# Patient Record
Sex: Male | Born: 1990 | Race: Black or African American | Hispanic: No | Marital: Single | Smoking: Former smoker
Health system: Southern US, Community
[De-identification: ages and names within clinical notes are randomized; demographics above are authoritative.]

## PROBLEM LIST (undated history)

## (undated) ENCOUNTER — Emergency Department (HOSPITAL_COMMUNITY): Payer: 59

## (undated) DIAGNOSIS — J40 Bronchitis, not specified as acute or chronic: Secondary | ICD-10-CM

## (undated) HISTORY — PX: MYRINGOTOMY WITH TUBE PLACEMENT: SHX5663

---
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
## 2011-01-06 NOTE — Assessment & Plan Note (Signed)
Summary: EPH   CC:  follow up hospital.  History of Present Illness: 20 year old male for evaluation of chest pain. Patient seen in emergency room in February of 2011 for chest pain. A chest x-ray showed no active disease. No blood work was obtained. The patient was given nonsteroidals and we were asked to further evaluate.  The patient states that he has had intermittent chest pain. It is in various locations on his chest. Patient states that he has had intermittent chest pain typically associated with stress. It is in various locations on his chest. It typically last one to 2 hours and resolved spontaneously. It does not radiate. There is no associated symptoms. It can increase with deep inspiration. He denies any dyspnea on exertion, orthopnea, PND, pedal edema palpitations or history of syncope. He does not have exertional chest pain. He has not traveled recently. Because of his chest pain we were asked to further evaluate.  Current Medications (verified): 1)  Centrum  Tabs (Multiple Vitamins-Minerals) .Marland Kitchen.. 1 Tab By Mouth Once Daily 2)  Aspirin 81 Mg Tbec (Aspirin) .... As Needed  Past History:  Past Medical History: Patient states that he does have problems with anxiety.  Past Surgical History: Prior tubes in ears  Family History: Reviewed history from 02/18/2010 and no changes required. Mother with H/O atrial fibrillation  Social History: Reviewed history from 02/18/2010 and no changes required. H/O occasional marijuana use in the past No Tobacco Occasional ETOH in the past  Review of Systems       no fevers or chills, productive cough, hemoptysis, dysphasia, odynophagia, melena, hematochezia, dysuria, hematuria, rash, seizure activity, orthopnea, PND, pedal edema, claudication. Remaining systems are negative.   Vital Signs:  Patient profile:   20 year old male Height:      76 inches Weight:      171 pounds BMI:     20.89 Pulse rate:   82 / minute Pulse rhythm:    regular Resp:     12 per minute BP sitting:   118 / 70  (left arm)  Vitals Entered By: Kem Parkinson (February 21, 2010 8:55 AM)  Physical Exam  General:      Well developed/well nourished in NAD Skin warm/dry Patient not depressed No peripheral clubbing Back-normal HEENT-normal/normal eyelids Neck supple/normal carotid upstroke bilaterally; no bruits; no JVD; no thyromegaly chest - CTA/ normal expansion CV - RRR/normal S1 and S2; no murmurs, rubs or gallops;  PMI nondisplaced; No change with Valsalva. Abdomen -NT/ND, no HSM, no mass, + bowel sounds, no bruit 2+ femoral pulses, no bruits Ext-no edema, chords, 2+ DP Neuro-grossly nonfocal     EKG  Procedure date:  02/21/2010  Findings:      Normal sinus rhythm at a rate of 82. Axis normal. No significant ST changes.  Impression & Recommendations:  Problem # 1:  OTHER CHEST PAIN (ICD-786.59) Patient complains of atypical chest pain. His electrocardiogram today is normal. Previous electrocardiogram from January 31, 2010 showed an incomplete right bundle branch block. I doubt his symptoms are cardiac in nature. I will schedule an echocardiogram to assess his LV function as well as an exercise treadmill. If negative I do not anticipate further cardiac evaluation. His updated medication list for this problem includes:    Aspirin 81 Mg Tbec (Aspirin) .Marland Kitchen... As needed  Orders: Echocardiogram (Echo) Treadmill (Treadmill)  Problem # 2:  ANXIETY (ICD-300.00) Patient does state he has had problems with anxiety. I will leave this to his primary care physician.  Patient Instructions: 1)  Your physician has requested that you have an echocardiogram.  Echocardiography is a painless test that uses sound waves to create images of your heart. It provides your doctor with information about the size and shape of your heart and how well your heart's chambers and valves are working.  This procedure takes approximately one hour. There are no  restrictions for this procedure. 2)  Your physician has requested that you have an exercise tolerance test.  For further information please visit [email or web address]  Please also follow instruction sheet, as given.

## 2011-01-06 NOTE — Letter (Signed)
Summary: Physicians Statement Sheet  Physicians Statement Sheet   Imported By: Erle Crocker 02/21/2010 08:50:40  _____________________________________________________________________  External Attachment:    Type:   Image     Comment:   External Document

## 2011-09-12 ENCOUNTER — Emergency Department (HOSPITAL_COMMUNITY)
Admission: EM | Admit: 2011-09-12 | Discharge: 2011-09-12 | Disposition: A | Discharge status: Home / Self Care | Payer: 59 | Attending: Emergency Medicine | Admitting: Emergency Medicine

## 2011-09-12 ENCOUNTER — Emergency Department (HOSPITAL_COMMUNITY): Payer: 59

## 2011-09-12 DIAGNOSIS — R05 Cough: Secondary | ICD-10-CM | POA: Insufficient documentation

## 2011-09-12 DIAGNOSIS — J40 Bronchitis, not specified as acute or chronic: Secondary | ICD-10-CM | POA: Insufficient documentation

## 2011-09-12 DIAGNOSIS — R059 Cough, unspecified: Secondary | ICD-10-CM | POA: Insufficient documentation

## 2012-02-19 ENCOUNTER — Emergency Department (HOSPITAL_COMMUNITY): Payer: 59

## 2012-02-19 ENCOUNTER — Encounter (HOSPITAL_COMMUNITY): Payer: Self-pay | Admitting: Emergency Medicine

## 2012-02-19 ENCOUNTER — Emergency Department (HOSPITAL_COMMUNITY)
Admission: EM | Admit: 2012-02-19 | Discharge: 2012-02-19 | Disposition: A | Discharge status: Home / Self Care | Payer: 59 | Attending: Emergency Medicine | Admitting: Emergency Medicine

## 2012-02-19 DIAGNOSIS — M546 Pain in thoracic spine: Secondary | ICD-10-CM | POA: Insufficient documentation

## 2012-02-19 DIAGNOSIS — R51 Headache: Secondary | ICD-10-CM | POA: Insufficient documentation

## 2012-02-19 DIAGNOSIS — M542 Cervicalgia: Secondary | ICD-10-CM | POA: Insufficient documentation

## 2012-02-19 DIAGNOSIS — R209 Unspecified disturbances of skin sensation: Secondary | ICD-10-CM | POA: Insufficient documentation

## 2012-02-19 DIAGNOSIS — IMO0001 Reserved for inherently not codable concepts without codable children: Secondary | ICD-10-CM | POA: Insufficient documentation

## 2012-02-19 MED ORDER — IBUPROFEN 800 MG PO TABS: 800.0000 mg | ORAL_TABLET | Freq: Three times a day (TID) | ORAL | Status: AC | PRN

## 2012-02-19 MED ORDER — DIAZEPAM 5 MG PO TABS: 5.0000 mg | ORAL_TABLET | Freq: Three times a day (TID) | ORAL | Status: AC | PRN

## 2012-02-19 MED ORDER — HYDROCODONE-ACETAMINOPHEN 5-325 MG PO TABS: 1.0000 | ORAL_TABLET | Freq: Four times a day (QID) | ORAL | Status: AC | PRN

## 2012-02-19 NOTE — ED Provider Notes (Signed)
Medical screening examination/treatment/procedure(s) were performed by non-physician practitioner and as supervising physician I was immediately available for consultation/collaboration.  Loren Racer, MD 02/19/12 2258
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
## 2012-05-24 ENCOUNTER — Encounter (HOSPITAL_COMMUNITY): Payer: Self-pay | Admitting: *Deleted

## 2012-05-24 ENCOUNTER — Emergency Department (HOSPITAL_COMMUNITY)
Admission: EM | Admit: 2012-05-24 | Discharge: 2012-05-25 | Disposition: A | Discharge status: Other Acute Inpatient Hospital | Payer: 59 | Attending: Emergency Medicine | Admitting: Emergency Medicine

## 2012-05-24 DIAGNOSIS — F1210 Cannabis abuse, uncomplicated: Secondary | ICD-10-CM | POA: Insufficient documentation

## 2012-05-24 DIAGNOSIS — F911 Conduct disorder, childhood-onset type: Secondary | ICD-10-CM | POA: Insufficient documentation

## 2012-05-24 DIAGNOSIS — F3289 Other specified depressive episodes: Secondary | ICD-10-CM | POA: Insufficient documentation

## 2012-05-24 DIAGNOSIS — F329 Major depressive disorder, single episode, unspecified: Secondary | ICD-10-CM

## 2012-05-24 DIAGNOSIS — F4320 Adjustment disorder, unspecified: Secondary | ICD-10-CM | POA: Insufficient documentation

## 2012-05-24 DIAGNOSIS — R454 Irritability and anger: Secondary | ICD-10-CM

## 2012-05-24 HISTORY — DX: Bronchitis, not specified as acute or chronic: J40

## 2012-05-24 LAB — CBC
HCT: 44.9 % (ref 39.0–52.0)
Hemoglobin: 15.5 g/dL (ref 13.0–17.0)
MCHC: 34.5 g/dL (ref 30.0–36.0)
MCV: 90.2 fL (ref 78.0–100.0)
Platelets: 183 10*3/uL (ref 150–400)
RDW: 13.6 % (ref 11.5–15.5)

## 2012-05-24 LAB — COMPREHENSIVE METABOLIC PANEL
AST: 14 U/L (ref 0–37)
Albumin: 4.9 g/dL (ref 3.5–5.2)
Alkaline Phosphatase: 58 U/L (ref 39–117)
BUN: 11 mg/dL (ref 6–23)
Calcium: 10.2 mg/dL (ref 8.4–10.5)
GFR calc Af Amer: >90 mL/min (ref >90)
Glucose, Bld: 83 mg/dL (ref 70–99)
Sodium: 140 mEq/L (ref 135–145)
Total Protein: 8.6 g/dL — ABNORMAL HIGH (ref 6.0–8.3)

## 2012-05-24 NOTE — ED Notes (Signed)
Pt reports episodes of severe anger and times where he "blacks out" during anger episodes - pt states after these episodes he is emotionally and physically drained, denies any known triggers. Pt states he rarely drinks, smoke marijuana, last use yesterday. Pt denies any visual or auditory hallucinations. Pt pleasant, calm and cooperative at present.

## 2012-05-25 MED ORDER — ONDANSETRON HCL 4 MG PO TABS: 4.0000 mg | ORAL_TABLET | Freq: Three times a day (TID) | ORAL | Status: DC | PRN

## 2012-05-25 MED ORDER — NICOTINE 21 MG/24HR TD PT24: 21.0000 mg | MEDICATED_PATCH | Freq: Every day | TRANSDERMAL | Status: DC

## 2012-05-25 MED ORDER — LORAZEPAM 1 MG PO TABS: 1.0000 mg | ORAL_TABLET | Freq: Three times a day (TID) | ORAL | Status: DC | PRN

## 2012-05-25 MED ORDER — ZOLPIDEM TARTRATE 5 MG PO TABS: 5.0000 mg | ORAL_TABLET | Freq: Every evening | ORAL | Status: DC | PRN

## 2012-05-25 MED ORDER — IBUPROFEN 600 MG PO TABS: 600.0000 mg | ORAL_TABLET | Freq: Three times a day (TID) | ORAL | Status: DC | PRN

## 2012-05-25 MED ORDER — ALUM & MAG HYDROXIDE-SIMETH 200-200-20 MG/5ML PO SUSP: 30.0000 mL | ORAL | Status: DC | PRN

## 2012-05-25 MED ORDER — ACETAMINOPHEN 325 MG PO TABS: 650.0000 mg | ORAL_TABLET | ORAL | Status: DC | PRN

## 2012-05-25 NOTE — ED Provider Notes (Signed)
Dr. Jacky Kindle (telepsychiatry) deems patient safe for discharge home.   Scott Seamen, MD 05/25/12 [phone]

## 2012-05-25 NOTE — Discharge Instructions (Signed)
Anger Management  Anger is a normal human emotion. However, anger can range from mild irritation to rage. When your anger becomes harmful to yourself or others, it is unhealthy anger.   CAUSES   There are many reasons for unhealthy anger. Many people learn how to express anger from observing how their family expressed anger. In troubled, chaotic, or abusive families, anger can be expressed as rage or even violence. Children can grow up never learning how healthy anger can be expressed. Factors that contribute to unhealthy anger include:    Drug or alcohol abuse.   Post-traumatic stress disorder.   Traumatic brain injury.  COMPLICATIONS   People with unhealthy anger tend to overreact and retaliate against a real or imagined threat. The need to retaliate can turn into violence or verbal abuse against another person. Chronic anger can lead to health problems, such as hypertension, high blood pressure, and depression.  TREATMENT   Exercising, relaxing, meditating, or writing out your feelings all can be beneficial in managing moderate anger. For unhealthy anger, the following methods may be used:   Cognitive-behavioral counseling (learning skills to change the thoughts that influence your mood).   Relaxation training.   Interpersonal counseling.   Assertive communication skills.   Medication.  Document Released: 09/20/2007 Document Revised: 11/12/2011 Document Reviewed: 01/29/2011  ExitCare Patient Information 2012 [employer].

## 2012-05-25 NOTE — ED Provider Notes (Signed)
History     CSN: [id number]  Arrival date & time 05/24/12  2033   First MD Initiated Contact with Patient 05/25/12 0013      12:35 AM [facility] Patient reports episodes of extreme anger and then sudden depression. Reports worsening in the last 2 weeks. States he has been under a significant amount of stress. Mother is with patient and states that she is concerned for his safety. Patient however denies suicidal ideation or homicidal ideation. Denies hallucinations or delusions. Reports occasional marijuana use otherwise denies drug or alcohol abuse. Family history of mental illness with his paternal grandmother. The history is provided by the patient and a parent.    Past Medical History  Diagnosis Date  . Bronchitis     History reviewed. No pertinent past surgical history.  History reviewed. No pertinent family history.  History  Substance Use Topics  . Smoking status: Never Smoker   . Smokeless tobacco: Not on file  . Alcohol Use: Yes     rarely      Review of Systems  Psychiatric/Behavioral: Negative for suicidal ideas, hallucinations and self-injury. The patient is nervous/anxious.   All other systems reviewed and are negative.    Allergies  Review of patient's allergies indicates no known allergies.  Home Medications  No current outpatient prescriptions on file.  BP 134/68  Pulse 91  Temp 98.5 F (36.9 C) (Oral)  Resp 20  SpO2 100%  Physical Exam  Constitutional: He is oriented to person, place, and time. He appears well-developed and well-nourished.  HENT:  Head: Normocephalic and atraumatic.  Eyes: Pupils are equal, round, and reactive to light.  Neurological: He is alert and oriented to person, place, and time.  Skin: Skin is warm and dry. No rash noted. No erythema. No pallor.  Psychiatric: He has a normal mood and affect. His behavior is normal. Judgment and thought content normal.    ED Course  Procedures    MDM    I have placed psych holding  orders for the patient and ordered a consult for tele psych. Discussed patient with Dr. Virgilio Belling, PA-C 05/25/12 [phone]

## 2012-05-25 NOTE — ED Notes (Signed)
Pt placed in blue scrubs and instructed to take all clothes off including undergarments. Pt stated he did not want to take underwear off because he gets cold. Writer informed pt he can get warm blankets back in psych ed. When pt came out of room after dressing writer asked if he took his underwear off. Pt agreed "yea." Security called to wand pt.

## 2012-05-25 NOTE — ED Provider Notes (Signed)
Medical screening examination/treatment/procedure(s) were performed by non-physician practitioner and as supervising physician I was immediately available for consultation/collaboration.   Hanley Seamen, MD 05/25/12 [phone]

## 2012-05-25 NOTE — ED Notes (Signed)
Pt and belongings wanded by security. Belongings placed behind triage nurses station. Belongings include pants, belt, shirt, shoes, hat, notebook, paper, socks, and underwear. Belongings going home with family member.

## 2013-04-12 ENCOUNTER — Ambulatory Visit: Payer: 59 | Admitting: Family Medicine

## 2013-04-27 ENCOUNTER — Ambulatory Visit (INDEPENDENT_AMBULATORY_CARE_PROVIDER_SITE_OTHER): Payer: 59 | Admitting: Family Medicine

## 2013-04-27 ENCOUNTER — Encounter: Payer: Self-pay | Admitting: Family Medicine

## 2013-04-27 VITALS — BP 119/61 | HR 61 | Temp 97.9°F | Ht 75.0 in | Wt 161.5 lb

## 2013-04-27 DIAGNOSIS — F39 Unspecified mood [affective] disorder: Secondary | ICD-10-CM | POA: Insufficient documentation

## 2013-04-27 LAB — CBC WITH DIFFERENTIAL/PLATELET
Basophils Absolute: 0 10*3/uL (ref 0.0–0.1)
Eosinophils Absolute: 0.1 10*3/uL (ref 0.0–0.7)
Lymphocytes Relative: 57 % — ABNORMAL HIGH (ref 12–46)
Monocytes Absolute: 0.3 10*3/uL (ref 0.1–1.0)
Neutro Abs: 1.1 10*3/uL — ABNORMAL LOW (ref 1.7–7.7)
Neutrophils Relative %: 31 % — ABNORMAL LOW (ref 43–77)
RBC: 4.85 MIL/uL (ref 4.22–5.81)
RDW: 14.4 % (ref 11.5–15.5)

## 2013-04-27 LAB — TSH: TSH: 1.243 u[IU]/mL (ref 0.350–4.500)

## 2013-04-27 NOTE — Addendum Note (Signed)
Addended by: Briscoe Deutscher on: 04/27/2013 04:02 PM   Modules accepted: Orders

## 2013-04-27 NOTE — Patient Instructions (Signed)
Scott Carlson, it was nice meeting you today.  It seems you may have some ongoing depression/anxiety.  We will try you on a medication called Prozac and follow you very closely over the next couple of weeks.  Also, please give Dr. Pascal Lux a call about setting up therapy with her.  If you have any questions or start to feel like you may hurt yourself or others, please call us or 911 and stop taking your medication.  Thanks, Dr Paulina Fusi   Fluoxetine capsules or tablets (Depression/Mood Disorders) What is this medicine? FLUOXETINE (floo OX e teen) belongs to a class of drugs known as selective serotonin reuptake inhibitors (SSRIs). It helps to treat mood problems such as depression, obsessive compulsive disorder, and panic attacks. It can also treat certain eating disorders. This medicine may be used for other purposes; ask your health care provider or pharmacist if you have questions. What should I tell my health care provider before I take this medicine? They need to know if you have any of these conditions: -bipolar disorder or mania -diabetes -glaucoma -liver disease -psychosis -seizures -suicidal thoughts or history of attempted suicide -an unusual or allergic reaction to fluoxetine, other medicines, foods, dyes, or preservatives -pregnant or trying to get pregnant -breast-feeding How should I use this medicine? Take this medicine by mouth with a glass of water. Follow the directions on the prescription label. You can take this medicine with or without food. Take your medicine at regular intervals. Do not take it more often than directed. Do not stop taking this medicine suddenly except upon the advice of your doctor. Stopping this medicine too quickly may cause serious side effects or your condition may worsen. A special MedGuide will be given to you by the pharmacist with each prescription and refill. Be sure to read this information carefully each time. Talk to your pediatrician regarding the use of  this medicine in children. While this drug may be prescribed for children as young as 7 years for selected conditions, precautions do apply. Overdosage: If you think you have taken too much of this medicine contact a poison control center or emergency room at once. NOTE: This medicine is only for you. Do not share this medicine with others. What if I miss a dose? If you miss a dose, skip the missed dose and go back to your regular dosing schedule. Do not take double or extra doses. What may interact with this medicine? Do not take fluoxetine with any of the following medications: -other medicines containing fluoxetine, like Sarafem or Symbyax -cisapride -linezolid -MAOIs like Carbex, Eldepryl, Marplan, Nardil, and Parnate -methylene blue (injected into a vein) -pimozide -thioridazine This medicine may also interact with the following medications: -alcohol -aspirin and aspirin-like medicines -carbamazepine -certain medicines for depression, anxiety, or psychotic disturbances -certain medicines for migraine headaches like almotriptan, eletriptan, frovatriptan, naratriptan, rizatriptan, sumatriptan, zolmitriptan -digoxin -diuretics -fentanyl -flecainide -furazolidone -isoniazid -lithium -medicines for sleep -medicines that treat or prevent blood clots like warfarin, enoxaparin, and dalteparin -NSAIDs, medicines for pain and inflammation, like ibuprofen or naproxen -phenytoin -procarbazine -propafenone -rasagiline -ritonavir -supplements like St. John's wort, kava kava, valerian -tramadol -tryptophan -vinblastine This list may not describe all possible interactions. Give your health care provider a list of all the medicines, herbs, non-prescription drugs, or dietary supplements you use. Also tell them if you smoke, drink alcohol, or use illegal drugs. Some items may interact with your medicine. What should I watch for while using this medicine? Tell your doctor if your symptoms  do not get better or if they get worse. Visit your doctor or health care professional for regular checks on your progress. Because it may take several weeks to see the full effects of this medicine, it is important to continue your treatment as prescribed by your doctor. Patients and their families should watch out for new or worsening thoughts of suicide or depression. Also watch out for sudden changes in feelings such as feeling anxious, agitated, panicky, irritable, hostile, aggressive, impulsive, severely restless, overly excited and hyperactive, or not being able to sleep. If this happens, especially at the beginning of treatment or after a change in dose, call your health care professional. [name] may get drowsy or dizzy. Do not drive, use machinery, or do anything that needs mental alertness until you know how this medicine affects you. Do not stand or sit up quickly, especially if you are an older patient. This reduces the risk of dizzy or fainting spells. Alcohol may interfere with the effect of this medicine. Avoid alcoholic drinks. Your mouth may get dry. Chewing sugarless gum or sucking hard candy, and drinking plenty of water may help. Contact your doctor if the problem does not go away or is severe. This medicine may affect blood sugar levels. If you have diabetes, check with your doctor or health care professional before you change your diet or the dose of your diabetic medicine. What side effects may I notice from receiving this medicine? Side effects that you should report to your doctor or health care professional as soon as possible: -allergic reactions like skin rash, itching or hives, swelling of the face, lips, or tongue -breathing problems -confusion -fast or irregular heart rate, palpitations -flu-like fever, chills, cough, muscle or joint aches and pains -seizures -suicidal thoughts or other mood changes -tremors -trouble sleeping -unusual bleeding or bruising -unusually tired  or weak -vomiting Side effects that usually do not require medical attention (report to your doctor or health care professional if they continue or are bothersome): -blurred vision -change in sex drive or performance -diarrhea -dry mouth -flushing -headache -increased or decreased appetite -nausea -sweating This list may not describe all possible side effects. Call your doctor for medical advice about side effects. You may report side effects to FDA at 1-800-FDA-1088. Where should I keep my medicine? Keep out of the reach of children. Store at room temperature between 15 and 30 degrees C (59 and 86 degrees F). Throw away any unused medicine after the expiration date. NOTE: This sheet is a summary. It may not cover all possible information. If you have questions about this medicine, talk to your doctor, pharmacist, or health care provider.  2013, [employer]. (04/08/2012 7:31:49 PM)

## 2013-04-27 NOTE — Progress Notes (Signed)
Parley Pidcock is a 22 y.o. male who presents today for new patient evaluation.  Pt C/O longstanding history of both anxiety and depression.  States he has had these since little child, and was most recently seen by [facility] for this about 1-2 years ago without follow up.  At that time, placed on Xanax which he has not taken in about one yr.  Over the past several weeks and couple of months, pt has had decreased sleep (4 hrs), decreased interest (not wanting to play bball), no guilty feelings, decreased energy and concentration, decreased appetite.  Denies SI/HI and ever having these.    For his anxiety, pt states that he sometimes feels overwhelmed and has pessimistic feelings.  When he has this anxiety, he has diaphoresis, palpitations, nausea, no vomiting and denies feelings of impending doom.   Pt has never been hospitalized for a psychiatric condition.  Does not know of any family members with psychiatry conditions.  Denies drug or alcohol use or abuse.   Past Medical History  Diagnosis Date  . Bronchitis     History  Smoking status  . Never Smoker   Smokeless tobacco  . Not on file    No family history on file.  No current outpatient prescriptions on file prior to visit.   No current facility-administered medications on file prior to visit.    ROS: Per HPI.  All other systems reviewed and are negative.   Physical Exam Filed Vitals:   04/27/13 1507  BP: 119/61  Pulse: 61  Temp: 97.9 F (36.6 C)    Physical Examination: General appearance - alert, well appearing, and in no distress Mental status - normal mood, behavior, speech, dress, motor activity, and thought processes Neck - supple, no significant adenopathy, no thyroid enlargment Chest - clear to auscultation, no wheezes, rales or rhonchi, symmetric air entry Heart - normal rate, regular rhythm, normal S1, S2, no murmurs, rubs, clicks or gallops

## 2013-04-27 NOTE — Assessment & Plan Note (Addendum)
After screening, pt with possible bipolar, moderate severe depression, and severe anxiety.  Will not start on medication today and will discuss with Dr. Pascal Lux about possible pathways to take from here.  Also gave pt Dr. Carola Rhine card for counseling as most likely beneficial.  Originally was going to start on Prozac and see back in one week but since pt screened + for possible Bipolar, will hold off on this and have further discussions.  Will see back in one week.  No Current SI/HI.  Will go ahead and get TSH/CBC to r/o secondary causes.  Addendum: After discussion with Dr. Pascal Lux, will have UDS performed on pt at next visit to evaluate for marijuana use.  Also, pt will be good candidate for psychiatrist referral, especially outpatient behavioral health as insurance through [facility] through mom.

## 2013-06-09 ENCOUNTER — Ambulatory Visit: Payer: Self-pay | Admitting: Family Medicine

## 2013-09-18 ENCOUNTER — Emergency Department (HOSPITAL_COMMUNITY): Payer: 59

## 2013-09-18 ENCOUNTER — Emergency Department (HOSPITAL_COMMUNITY)
Admission: EM | Admit: 2013-09-18 | Discharge: 2013-09-18 | Disposition: A | Discharge status: Home / Self Care | Payer: 59 | Attending: Emergency Medicine | Admitting: Emergency Medicine

## 2013-09-18 ENCOUNTER — Encounter (HOSPITAL_COMMUNITY): Payer: Self-pay | Admitting: Emergency Medicine

## 2013-09-18 DIAGNOSIS — S5010XA Contusion of unspecified forearm, initial encounter: Secondary | ICD-10-CM | POA: Insufficient documentation

## 2013-09-18 DIAGNOSIS — S5012XA Contusion of left forearm, initial encounter: Secondary | ICD-10-CM

## 2013-09-18 MED ORDER — IBUPROFEN 200 MG PO TABS
600.0000 mg | ORAL_TABLET | Freq: Once | ORAL | Status: AC
  Administered 2013-09-18: 600 mg via ORAL
  Filled 2013-09-18: qty 3

## 2013-09-18 MED ORDER — TETANUS-DIPHTH-ACELL PERTUSSIS 5-2.5-18.5 LF-MCG/0.5 IM SUSP
0.5000 mL | Freq: Once | INTRAMUSCULAR | Status: AC
  Administered 2013-09-18: 0.5 mL via INTRAMUSCULAR
  Filled 2013-09-18: qty 0.5
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
## 2013-09-21 NOTE — ED Provider Notes (Signed)
Medical screening examination/treatment/procedure(s) were performed by non-physician practitioner and as supervising physician I was immediately available for consultation/collaboration.   Maloni Musleh J Christropher Gintz, MD 09/21/13 1419 

## 2013-12-11 ENCOUNTER — Emergency Department (HOSPITAL_COMMUNITY): Payer: 59

## 2013-12-11 ENCOUNTER — Encounter (HOSPITAL_COMMUNITY): Payer: Self-pay | Admitting: Emergency Medicine

## 2013-12-11 ENCOUNTER — Emergency Department (HOSPITAL_COMMUNITY)
Admission: EM | Admit: 2013-12-11 | Discharge: 2013-12-11 | Disposition: A | Discharge status: Home / Self Care | Payer: 59 | Attending: Emergency Medicine | Admitting: Emergency Medicine

## 2013-12-11 DIAGNOSIS — F419 Anxiety disorder, unspecified: Secondary | ICD-10-CM

## 2013-12-11 DIAGNOSIS — F411 Generalized anxiety disorder: Secondary | ICD-10-CM | POA: Insufficient documentation

## 2013-12-11 DIAGNOSIS — R1013 Epigastric pain: Secondary | ICD-10-CM | POA: Insufficient documentation

## 2013-12-11 DIAGNOSIS — R197 Diarrhea, unspecified: Secondary | ICD-10-CM | POA: Insufficient documentation

## 2013-12-11 DIAGNOSIS — Z8709 Personal history of other diseases of the respiratory system: Secondary | ICD-10-CM | POA: Insufficient documentation

## 2013-12-11 LAB — CBC WITH DIFFERENTIAL/PLATELET
BASOS PCT: 0 % (ref 0–1)
Basophils Absolute: 0 10*3/uL (ref 0.0–0.1)
EOS ABS: 0.1 10*3/uL (ref 0.0–0.7)
EOS PCT: 2 % (ref 0–5)
HCT: 43.6 % (ref 39.0–52.0)
Hemoglobin: 15.3 g/dL (ref 13.0–17.0)
Lymphocytes Relative: 35 % (ref 12–46)
Lymphs Abs: 1.9 10*3/uL (ref 0.7–4.0)
MCH: 31.2 pg (ref 26.0–34.0)
MCHC: 35.1 g/dL (ref 30.0–36.0)
MCV: 88.8 fL (ref 78.0–100.0)
MONOS PCT: 10 % (ref 3–12)
Monocytes Absolute: 0.5 10*3/uL (ref 0.1–1.0)
Neutro Abs: 2.9 10*3/uL (ref 1.7–7.7)
Neutrophils Relative %: 52 % (ref 43–77)
Platelets: 191 10*3/uL (ref 150–400)
RBC: 4.91 MIL/uL (ref 4.22–5.81)
RDW: 13 % (ref 11.5–15.5)
WBC: 5.5 10*3/uL (ref 4.0–10.5)

## 2013-12-11 LAB — COMPREHENSIVE METABOLIC PANEL
ALK PHOS: 53 U/L (ref 39–117)
ALT: 9 U/L (ref 0–53)
AST: 17 U/L (ref 0–37)
Albumin: 4.4 g/dL (ref 3.5–5.2)
BILIRUBIN TOTAL: 0.6 mg/dL (ref 0.3–1.2)
BUN: 14 mg/dL (ref 6–23)
CHLORIDE: 102 meq/L (ref 96–112)
CO2: 27 mEq/L (ref 19–32)
Calcium: 9.9 mg/dL (ref 8.4–10.5)
Creatinine, Ser: 0.98 mg/dL (ref 0.50–1.35)
GFR calc Af Amer: >90 mL/min (ref >90)
GLUCOSE: 94 mg/dL (ref 70–99)
POTASSIUM: 3.8 meq/L (ref 3.7–5.3)
SODIUM: 142 meq/L (ref 137–147)
Total Protein: 8.1 g/dL (ref 6.0–8.3)

## 2013-12-11 LAB — LIPASE, BLOOD: Lipase: 30 U/L (ref 11–59)

## 2013-12-11 MED ORDER — SODIUM CHLORIDE 0.9 % IV BOLUS (SEPSIS)
1000.0000 mL | Freq: Once | INTRAVENOUS | Status: AC
  Administered 2013-12-11: 1000 mL via INTRAVENOUS

## 2013-12-11 MED ORDER — ONDANSETRON HCL 4 MG/2ML IJ SOLN
4.0000 mg | Freq: Once | INTRAMUSCULAR | Status: AC
  Administered 2013-12-11: 4 mg via INTRAVENOUS

## 2013-12-11 MED ORDER — ONDANSETRON HCL 4 MG/2ML IJ SOLN
4.0000 mg | Freq: Once | INTRAMUSCULAR | Status: DC
  Filled 2013-12-11: qty 2

## 2013-12-11 MED ORDER — MORPHINE SULFATE 4 MG/ML IJ SOLN
4.0000 mg | Freq: Once | INTRAMUSCULAR | Status: AC
  Administered 2013-12-11: 4 mg via INTRAVENOUS
  Filled 2013-12-11: qty 1

## 2013-12-11 MED ORDER — LORAZEPAM 0.5 MG PO TABS: 1.0000 mg | ORAL_TABLET | Freq: Three times a day (TID) | ORAL | Status: DC | PRN

## 2013-12-11 MED ORDER — GI COCKTAIL ~~LOC~~
30.0000 mL | Freq: Once | ORAL | Status: AC
  Administered 2013-12-11: 30 mL via ORAL
  Filled 2013-12-11: qty 30

## 2013-12-11 NOTE — ED Notes (Signed)
Pt states he has been feeling weak x 2 days.  Pt states he has RUQ abdominal pain, denies nausea and vomiting. + diarrhea x 2 in 24 hours.

## 2013-12-11 NOTE — ED Provider Notes (Signed)
CSN: [id number]     Arrival date & time 12/11/13  [id number] History   First MD Initiated Contact with Patient 12/11/13 [phone]     Chief Complaint  Patient presents with  . Abdominal Pain   (Consider location/radiation/quality/duration/timing/severity/associated sxs/prior Treatment) HPI  Scott Carlson is a 23 y.o.male without any significant PMH presents to the ER with complaints of epigastric abdominal pain for the past 2 days with nausea and a small amount of diarrhea. He reports the pain as being constant but has been waxing and waning. He denies having pain like this before, denies having previous abdominal surgeries. He has not had flu like symptoms of sinus congestion, cough, sore throat, myalgias, fever. He also denies taking NSAIDS, eating spicy foods, chocolate, mint, citrus products. He denies injury or anyone else around him feeling the same way. Eating intermittently makes it feel better but he describes feeling full quicker than normal.   Past Medical History  Diagnosis Date  . Bronchitis    Past Surgical History  Procedure Laterality Date  . Myringotomy with tube placement     No family history on file. History  Substance Use Topics  . Smoking status: Never Smoker   . Smokeless tobacco: Not on file  . Alcohol Use: Yes     Comment: Occasionally    Review of Systems The patient denies anorexia, fever, weight loss,, vision loss, decreased hearing, hoarseness, chest pain, syncope, dyspnea on exertion, peripheral edema, balance deficits, hemoptysis, melena, hematochezia, severe indigestion/heartburn, hematuria, incontinence, genital sores, muscle weakness, suspicious skin lesions, transient blindness, difficulty walking, depression, unusual weight change, abnormal bleeding, enlarged lymph nodes, angioedema, and breast masses.  Allergies  Review of patient's allergies indicates no known allergies.  Home Medications   Current Outpatient Rx  Name  Route  Sig  Dispense  Refill    . acetaminophen (TYLENOL) 500 MG tablet   Oral   Take 500 mg by mouth every 6 (six) hours as needed (pain).         . LORazepam (ATIVAN) 0.5 MG tablet   Oral   Take 2 tablets (1 mg total) by mouth 3 (three) times daily as needed for anxiety.   15 tablet   0    BP 122/72  Pulse 65  Temp(Src) 98.1 F (36.7 C) (Oral)  Resp 16  Ht 6\' 3"  (1.905 m)  Wt 175 lb (79.379 kg)  BMI 21.87 kg/m2  SpO2 98% Physical Exam  Nursing note and vitals reviewed. Constitutional: He appears well-developed and well-nourished. No distress.  HENT:  Head: Normocephalic and atraumatic.  Eyes: Pupils are equal, round, and reactive to light.  Neck: Normal range of motion. Neck supple.  Cardiovascular: Normal rate and regular rhythm.   Pulmonary/Chest: Effort normal.  Abdominal: Soft. There is tenderness in the epigastric area. There is guarding (vountary). There is no rigidity, no rebound, no CVA tenderness, no tenderness at McBurney's point and negative Murphy's sign.    Neurological: He is alert.  Skin: Skin is warm and dry.    ED Course  Procedures (including critical care time) Labs Review Labs Reviewed  COMPREHENSIVE METABOLIC PANEL  LIPASE, BLOOD  CBC WITH DIFFERENTIAL   Imaging Review Dg Abd 2 Views  12/11/2013   CLINICAL DATA:  Upper abdominal pain.  Diarrhea.  EXAM: ABDOMEN - 2 VIEW  COMPARISON:  No priors.  FINDINGS: Gas and stool are seen scattered throughout the colon extending to the level of the distal rectum. No pathologic distension of small bowel is noted. No  gross evidence of pneumoperitoneum.  IMPRESSION: 1.  Nonobstructive bowel gas pattern. 2. No pneumoperitoneum.   Electronically Signed   By: Trudie Reed M.D.   On: 12/11/2013 08:03    EKG Interpretation   None       MDM   1. Diarrhea   2. Anxiety    Patients labs and xray are showing no concerning findings.   Mom present at end of patient contact and says he has really bad anxiety and plans to get him to a  behavioral health provider. Patient no longer having any pain or symptoms.  23 y.o.Scott Carlson's evaluation in the Emergency Department is complete. It has been determined that no acute conditions requiring further emergency intervention are present at this time. The patient/guardian have been advised of the diagnosis and plan. We have discussed signs and symptoms that warrant return to the ED, such as changes or worsening in symptoms.  Vital signs are stable at discharge. Filed Vitals:   12/11/13 0601  BP: 122/72  Pulse: 65  Temp: 98.1 F (36.7 C)  Resp: 16    Patient/guardian has voiced understanding and agreed to follow-up with the PCP or specialist.    Dorthula Matas, PA-C 12/11/13 [phone]

## 2013-12-11 NOTE — ED Provider Notes (Signed)
Medical screening examination/treatment/procedure(s) were performed by non-physician practitioner and as supervising physician I was immediately available for consultation/collaboration.  EKG Interpretation   None        Shon Batonourtney F Gerlean Cid, MD 12/11/13 1701

## 2013-12-11 NOTE — Discharge Instructions (Signed)
Diarrhea Diarrhea is frequent loose and watery bowel movements. It can cause you to feel weak and dehydrated. Dehydration can cause you to become tired and thirsty, have a dry mouth, and have decreased urination that often is dark yellow. Diarrhea is a sign of another problem, most often an infection that will not last long. In most cases, diarrhea typically lasts 2 3 days. However, it can last longer if it is a sign of something more serious. It is important to treat your diarrhea as directed by your caregive to lessen or prevent future episodes of diarrhea. CAUSES  Some common causes include:  Gastrointestinal infections caused by viruses, bacteria, or parasites.  Food poisoning or food allergies.  Certain medicines, such as antibiotics, chemotherapy, and laxatives.  Artificial sweeteners and fructose.  Digestive disorders. HOME CARE INSTRUCTIONS  Ensure adequate fluid intake (hydration): have 1 cup (8 oz) of fluid for each diarrhea episode. Avoid fluids that contain simple sugars or sports drinks, fruit juices, whole milk products, and sodas. Your urine should be clear or pale yellow if you are drinking enough fluids. Hydrate with an oral rehydration solution that you can purchase at pharmacies, retail stores, and online. You can prepare an oral rehydration solution at home by mixing the following ingredients together:    tsp table salt.   tsp baking soda.   tsp salt substitute containing potassium chloride.  1  tablespoons sugar.  1 L (34 oz) of water.  Certain foods and beverages may increase the speed at which food moves through the gastrointestinal (GI) tract. These foods and beverages should be avoided and include:  Caffeinated and alcoholic beverages.  High-fiber foods, such as raw fruits and vegetables, nuts, seeds, and whole grain breads and cereals.  Foods and beverages sweetened with sugar alcohols, such as xylitol, sorbitol, and mannitol.  Some foods may be well  tolerated and may help thicken stool including:  Starchy foods, such as rice, toast, pasta, low-sugar cereal, oatmeal, grits, baked potatoes, crackers, and bagels.  Bananas.  Applesauce.  Add probiotic-rich foods to help increase healthy bacteria in the GI tract, such as yogurt and fermented milk products.  Wash your hands well after each diarrhea episode.  Only take over-the-counter or prescription medicines as directed by your caregiver.  Take a warm bath to relieve any burning or pain from frequent diarrhea episodes. SEEK IMMEDIATE MEDICAL CARE IF:   You are unable to keep fluids down.  You have persistent vomiting.  You have blood in your stool, or your stools are black and tarry.  You do not urinate in 6 8 hours, or there is only a small amount of very dark urine.  You have abdominal pain that increases or localizes.  You have weakness, dizziness, confusion, or lightheadedness.  You have a severe headache.  Your diarrhea gets worse or does not get better.  You have a fever or persistent symptoms for more than 2 3 days.  You have a fever and your symptoms suddenly get worse. MAKE SURE YOU:   Understand these instructions.  Will watch your condition.  Will get help right away if you are not doing well or get worse. Document Released: 11/13/2002 Document Revised: 11/09/2012 Document Reviewed: 07/31/2012 [facility] Patient Information 2014 [place], [place]. Generalized Anxiety Disorder Generalized anxiety disorder (GAD) is a mental disorder. It interferes with life functions, including relationships, work, and school. GAD is different from normal anxiety, which everyone experiences at some point in their lives in response to specific life events and  activities. Normal anxiety actually helps [place] prepare for and get through these life events and activities. Normal anxiety goes away after the event or activity is over.  GAD causes anxiety that is not necessarily related to  specific events or activities. It also causes excess anxiety in proportion to specific events or activities. The anxiety associated with GAD is also difficult to control. GAD can vary from mild to severe. People with severe GAD can have intense waves of anxiety with physical symptoms (panic attacks).  SYMPTOMS The anxiety and worry associated with GAD are difficult to control. This anxiety and worry are related to many life events and activities and also occur more days than not for 6 months or longer. People with GAD also have three or more of the following symptoms (one or more in children):  Restlessness.   Fatigue.  Difficulty concentrating.   Irritability.  Muscle tension.  Difficulty sleeping or unsatisfying sleep. DIAGNOSIS GAD is diagnosed through an assessment by your caregiver. Your caregiver will ask you questions aboutyour mood,physical symptoms, and events in your life. Your caregiver may ask you about your medical history and use of alcohol or drugs, including prescription medications. Your caregiver may also do a physical exam and blood tests. Certain medical conditions and the use of certain substances can cause symptoms similar to those associated with GAD. Your caregiver may refer you to a mental health specialist for further evaluation. TREATMENT The following therapies are usually used to treat GAD:   Medication Antidepressant medication usually is prescribed for long-term daily control. Antianxiety medications may be added in severe cases, especially when panic attacks occur.   Talk therapy (psychotherapy) Certain types of talk therapy can be helpful in treating GAD by providing support, education, and guidance. A form of talk therapy called cognitive behavioral therapy can teach you healthy ways to think about and react to daily life events and activities.  Stress managementtechniques These include yoga, meditation, and exercise and can be very helpful when they  are practiced regularly. A mental health specialist can help determine which treatment is best for you. Some people see improvement with one therapy. However, other people require a combination of therapies. Document Released: 03/20/2013 Document Reviewed: 03/20/2013 [facility] Patient Information 2014 [place], [place].

## 2013-12-11 NOTE — ED Notes (Signed)
Pt c/o sharp epigastric pain x 3 days, +nausea, +diarrhea. A & O

## 2014-01-26 ENCOUNTER — Encounter (HOSPITAL_COMMUNITY): Payer: Self-pay | Admitting: Emergency Medicine

## 2014-01-26 ENCOUNTER — Emergency Department (HOSPITAL_COMMUNITY)
Admission: EM | Admit: 2014-01-26 | Discharge: 2014-01-27 | Disposition: A | Discharge status: Home / Self Care | Payer: No Typology Code available for payment source | Attending: Emergency Medicine | Admitting: Emergency Medicine

## 2014-01-26 DIAGNOSIS — Z8709 Personal history of other diseases of the respiratory system: Secondary | ICD-10-CM | POA: Insufficient documentation

## 2014-01-26 DIAGNOSIS — Y9389 Activity, other specified: Secondary | ICD-10-CM | POA: Insufficient documentation

## 2014-01-26 DIAGNOSIS — S0990XA Unspecified injury of head, initial encounter: Secondary | ICD-10-CM | POA: Insufficient documentation

## 2014-01-26 DIAGNOSIS — T148XXA Other injury of unspecified body region, initial encounter: Secondary | ICD-10-CM

## 2014-01-26 DIAGNOSIS — S139XXA Sprain of joints and ligaments of unspecified parts of neck, initial encounter: Secondary | ICD-10-CM | POA: Insufficient documentation

## 2014-01-26 DIAGNOSIS — F17200 Nicotine dependence, unspecified, uncomplicated: Secondary | ICD-10-CM | POA: Insufficient documentation

## 2014-01-26 DIAGNOSIS — R51 Headache: Secondary | ICD-10-CM

## 2014-01-26 DIAGNOSIS — Y92410 Unspecified street and highway as the place of occurrence of the external cause: Secondary | ICD-10-CM | POA: Insufficient documentation

## 2014-01-26 DIAGNOSIS — R519 Headache, unspecified: Secondary | ICD-10-CM
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
## 2014-01-27 MED ORDER — IBUPROFEN 800 MG PO TABS
800.0000 mg | ORAL_TABLET | Freq: Once | ORAL | Status: DC
  Filled 2014-01-27: qty 1

## 2014-01-27 MED ORDER — ACETAMINOPHEN 325 MG PO TABS
650.0000 mg | ORAL_TABLET | Freq: Once | ORAL | Status: AC
  Administered 2014-01-27: 650 mg via ORAL
  Filled 2014-01-27: qty 2
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
## 2014-01-28 NOTE — ED Provider Notes (Signed)
Medical screening examination/treatment/procedure(s) were performed by non-physician practitioner and as supervising physician I was immediately available for consultation/collaboration.   Damariz Paganelli, MD 01/28/14 0545 

## 2014-02-10 ENCOUNTER — Emergency Department (HOSPITAL_COMMUNITY)
Admission: EM | Admit: 2014-02-10 | Discharge: 2014-02-10 | Disposition: A | Discharge status: Home / Self Care | Payer: 59 | Attending: Emergency Medicine | Admitting: Emergency Medicine

## 2014-02-10 ENCOUNTER — Encounter (HOSPITAL_COMMUNITY): Payer: Self-pay | Admitting: Emergency Medicine

## 2014-02-10 DIAGNOSIS — Z8709 Personal history of other diseases of the respiratory system: Secondary | ICD-10-CM | POA: Insufficient documentation

## 2014-02-10 DIAGNOSIS — F17200 Nicotine dependence, unspecified, uncomplicated: Secondary | ICD-10-CM | POA: Insufficient documentation

## 2014-02-10 DIAGNOSIS — Z202 Contact with and (suspected) exposure to infections with a predominantly sexual mode of transmission: Secondary | ICD-10-CM | POA: Insufficient documentation

## 2014-02-10 LAB — RPR: RPR Ser Ql: NONREACTIVE

## 2014-02-10 LAB — HIV ANTIBODY (ROUTINE TESTING W REFLEX): HIV: NONREACTIVE

## 2014-02-10 MED ORDER — AZITHROMYCIN 250 MG PO TABS
1000.0000 mg | ORAL_TABLET | Freq: Once | ORAL | Status: AC
  Administered 2014-02-10: 1000 mg via ORAL
  Filled 2014-02-10: qty 4

## 2014-02-10 MED ORDER — CEFTRIAXONE SODIUM 250 MG IJ SOLR
250.0000 mg | Freq: Once | INTRAMUSCULAR | Status: AC
  Administered 2014-02-10: 250 mg via INTRAMUSCULAR
  Filled 2014-02-10: qty 250

## 2014-02-10 NOTE — Discharge Instructions (Signed)
No intercourse for 1 week. You were treated today for possible gonorrhea and chlamydia. The rest of your tests are pending. You will be notified if they return abnormal. Follow up with your docotr.    Gonorrhea Gonorrhea is an infection that can cause serious problems. If left untreated, may   Damage the male or male organs.   Cause women to be unable to have children (sterility).   Harm a fetus, if the infected woman is pregnant.  It is important to get treatment for gonorrhea as soon as possible. It is also necessary that all your sexual partners be tested for the infection.  CAUSES  Gonorrhea is caused by bacteria called Neisseria gonorrhoeae. The infection is spread from person to person, usually by sexual contact (such as by anal, vaginal, or oral means). A newborn can contract the infection from his or her mother during birth.  SYMPTOMS  Some people with gonorrhea do not have symptoms. Symptoms may be different in females and males.  Females The most common symptoms are:   Pain in the lower abdomen.   Fever with or without chills.  Other symptoms include:   Abnormal vaginal discharge.   Painful intercourse.   Burning or itching of the vagina or lips of the vagina.   Abnormal vaginal bleeding.   Pain when urinating.   Long-lasting (chronic) pain in the lower abdomen, especially during menstruation or intercourse.   Inability to become pregnant.   Going into premature labor.   Irritation, pain, bleeding, or discharge from the rectum. This may occur if the infection was spread by anal sex.   Sore throat or swollen neck lymph nodes. This may occur if the infection was spread by oral sex.  Males The most common symptoms are:   Discharge from the penis.   Pain or burning during urination.   Pain or swelling in the testicles. Other symptoms may include:   Irritation, pain, bleeding, or discharge from the rectum. This may occur if the infection  was spread by anal sex.   Sore throat, fever, or swollen neck lymph nodes. This may occur if the infection was spread by oral sex.  DIAGNOSIS  A diagnosis is made after a physical exam is done and a sample of discharge is examined under a microscope for the presence of the bacteria. The discharge may be taken from the urethra, cervix, throat, or rectum.  TREATMENT  Gonorrhea is treated with antibiotic medicines. It is important for treatment to begin as soon as possible. Early treatment may prevent some problems from developing.  HOME CARE INSTRUCTIONS   Only take over-the-counter or prescription medicines for pain, fever, or discomfort as directed by your health care provider.   Take antibiotics as directed. Make sure you finish them even if you start to feel better. Incomplete treatment will put you at risk for continued infection.   Do not have sex until treatment is complete or as directed by your health care provider.   Follow up with your health care provider as directed.   Not all test results are available during your visit. If your test results are not back during the visit, make an appointment with your health care provider to find out the results. Do not assume everything is normal if you have not heard from your health care provider or the medical facility. It is important for you to follow up on all of your test results.   If you test positive for gonorrhea, inform your recent sexual  partners. They need to be checked for gonorrhea even if they do not have symptoms. They may need treatment, even if they test negative for gonorrhea.  SEEK MEDICAL CARE IF:   You develop any bad reaction to the medicine you were prescribed. This may include:   A rash.   Nausea.   Vomiting.   Diarrhea.   Your symptoms do not improve after a few days of taking antibiotics.   Your symptoms get worse.   You develop increased pain, such as in the testicles (for males) or in the  abdomen (for females).  SEEK IMMEDIATE MEDICAL CARE IF:  You have a fever or persistent symptoms for more than 2 3 days.   You have a fever and your symptoms suddenly get worse.  MAKE SURE YOU:   Understand these instructions.  Will watch your condition.  Will get help right away if you are not doing well or get worse. Document Released: 11/20/2000 Document Revised: 09/13/2013 Document Reviewed: 05/31/2013 [facility] Patient Information 2014 [place], [place]. Safe Sex Safe sex is about reducing the risk of giving or getting a sexually transmitted disease (STD). STDs are spread through sexual contact involving the genitals, mouth, or rectum. Some STDS can be cured and others cannot. Safe sex can also prevent unintended pregnancies.  SAFE SEX PRACTICES  Limit your sexual activity to only one partner who is only having sex with you.  Talk to your partner about their past partners, past STDs, and drug use.  Use a condom every time you have sexual intercourse. This includes vaginal, oral, and anal sexual activity. Both females and males should wear condoms during oral sex. Only use latex or polyurethane condoms and water-based lubricants. Petroleum-based lubricants or oils used to lubricate a condom will weaken the condom and increase the chance that it will break. The condom should be in place from the beginning to the end of sexual activity. Wearing a condom reduces, but does not completely eliminate, your risk of getting or giving a STD. STDs can be spread by contact with skin of surrounding areas.  Get vaccinated for hepatitis B and HPV.  Avoid alcohol and recreational drugs which can affect your judgement. You may forget to use a condom or participate in high-risk sex.  For females, avoid douching after sexual intercourse. Douching can spread an infection farther into the reproductive tract.  Check your body for signs of sores, blisters, rashes, or unusual discharge. See your caregiver  if you notice any of these signs.  Avoid sexual contact if you have symptoms of an infection or are being treated for an STD. If you or your partner has herpes, avoid sexual contact when blisters are present. Use condoms at all other times.  See your caregiver for regular screenings, examinations, and tests for STDs. Before having sex with a new partner, each of you should be screened for STDs and talk about the results with your partner. BENEFITS OF SAFE SEX   There is less of a chance of getting or giving an STD.  You can prevent unwanted or unintended pregnancies.  By discussing safer sex concerns with your partner, you may increase feelings of intimacy, comfort, trust, and honesty between the both of you. Document Released: 12/31/2004 Document Revised: 08/17/2012 Document Reviewed: 05/16/2012 [facility] [place] Patient Information 2014 [place], [place].

## 2014-02-10 NOTE — ED Provider Notes (Signed)
CSN: [id number]     Arrival date & time 02/10/14  0204 History   First MD Initiated Contact with Patient 02/10/14 0305     Chief Complaint  Patient presents with  . Exposure to STD     (Consider location/radiation/quality/duration/timing/severity/associated sxs/prior Treatment) HPI Scott Carlson is a 23 y.o. male who presents emergency department with complaint of wanting to get tested for STDs. Patient states his front as told him that she tested positive for gonorrhea. He reports he has not had intercourse with her in 3 weeks however states he wants to be tested. He denies any symptoms. He denies any dysuria, no penile lesions, no penile discharge, no pain or swelling in his scrotum. He has no history of STDs. He denies any other complaints.  Past Medical History  Diagnosis Date  . Bronchitis    Past Surgical History  Procedure Laterality Date  . Myringotomy with tube placement     No family history on file. History  Substance Use Topics  . Smoking status: Current Every Day Smoker -- 0.50 packs/day    Types: Cigarettes  . Smokeless tobacco: Never Used  . Alcohol Use: Yes     Comment: Occasionally    Review of Systems  Constitutional: Negative for fever and chills.  Gastrointestinal: Negative for abdominal pain.  Genitourinary: Negative for dysuria, frequency, hematuria, discharge, penile swelling, scrotal swelling, genital sores, penile pain and testicular pain.      Allergies  Review of patient's allergies indicates no known allergies.  Home Medications  No current outpatient prescriptions on file. BP 134/67  Pulse 85  Temp(Src) 98.6 F (37 C) (Oral)  Resp 18  Ht 6\' 3"  (1.905 m)  Wt 175 lb (79.379 kg)  BMI 21.87 kg/m2  SpO2 99% Physical Exam  Nursing note and vitals reviewed. Constitutional: He appears well-developed and well-nourished. No distress.  Genitourinary: Penis normal. No penile tenderness.  Scrotum normal, with no masses or tenderness to  palpation. No lesions, no penile discharge.  Neurological: He is alert.  Skin: Skin is warm and dry.    ED Course  Procedures (including critical care time) Labs Review Labs Reviewed  GC/CHLAMYDIA PROBE AMP  RPR  HIV ANTIBODY (ROUTINE TESTING)   Imaging Review No results found.   EKG Interpretation None      MDM   Final diagnoses:  Exposure to STD   Patient in emergency department because he was exposed to gonorrhea, states his girlfriend tested positive. Patient requesting all STD test checked. Have treated in emergency department Rocephin 250 mg IM and Zithromax 1 g by mouth. Gonorrhea, chlamydia, RPR, HIV test sent. Patient instructed to followup with his Dr. for results.  Filed Vitals:   02/10/14 0226 02/10/14 0230 02/10/14 0451  BP: 134/67  113/68  Pulse: 85  88  Temp: 98.6 F (37 C)    TempSrc: Oral    Resp: 18  17  Height:  6\' 3"  (1.905 m)   Weight:  175 lb (79.379 kg)   SpO2: 99%  98%        Lottie Musselatyana A Tamina Cyphers, PA-C 02/10/14 0454

## 2014-02-10 NOTE — ED Provider Notes (Signed)
Medical screening examination/treatment/procedure(s) were performed by non-physician practitioner and as supervising physician I was immediately available for consultation/collaboration.    Brandt LoosenJulie Nickson Middlesworth, MD 02/10/14 [phone]

## 2014-02-10 NOTE — ED Notes (Signed)
Pt states he found out his girlfriend tested + for Gonorrhea, pt would like to be tested as well. Pt denies all s/s

## 2014-02-11 LAB — GC/CHLAMYDIA PROBE AMP
CT PROBE, AMP APTIMA: NEGATIVE
GC Probe RNA: NEGATIVE

## 2014-03-13 ENCOUNTER — Emergency Department: Payer: Self-pay | Admitting: Emergency Medicine

## 2014-03-13 LAB — COMPREHENSIVE METABOLIC PANEL
ALBUMIN: 4 g/dL (ref 3.4–5.0)
ALK PHOS: 53 U/L
ALT: 17 U/L (ref 12–78)
Anion Gap: 4 — ABNORMAL LOW (ref 7–16)
BUN: 13 mg/dL (ref 7–18)
Bilirubin,Total: 0.4 mg/dL (ref 0.2–1.0)
CALCIUM: 9.2 mg/dL (ref 8.5–10.1)
CHLORIDE: 108 mmol/L — AB (ref 98–107)
Co2: 29 mmol/L (ref 21–32)
Creatinine: 1.17 mg/dL (ref 0.60–1.30)
EGFR (African American): >60
Glucose: 92 mg/dL (ref 65–99)
Osmolality: 281 (ref 275–301)
Potassium: 4 mmol/L (ref 3.5–5.1)
SGOT(AST): 10 U/L — ABNORMAL LOW (ref 15–37)
Sodium: 141 mmol/L (ref 136–145)
Total Protein: 8.2 g/dL (ref 6.4–8.2)

## 2014-03-13 LAB — CBC
HCT: 46.9 % (ref 40.0–52.0)
HGB: 15.8 g/dL (ref 13.0–18.0)
MCH: 30.7 pg (ref 26.0–34.0)
MCHC: 33.7 g/dL (ref 32.0–36.0)
MCV: 91 fL (ref 80–100)
Platelet: 177 10*3/uL (ref 150–440)
RBC: 5.14 10*6/uL (ref 4.40–5.90)
RDW: 14.1 % (ref 11.5–14.5)
WBC: 8.3 10*3/uL (ref 3.8–10.6)

## 2014-03-13 LAB — LIPASE, BLOOD: Lipase: 90 U/L (ref 73–393)

## 2014-03-21 ENCOUNTER — Emergency Department (HOSPITAL_COMMUNITY): Payer: 59

## 2014-03-21 ENCOUNTER — Encounter (HOSPITAL_COMMUNITY): Payer: Self-pay | Admitting: Emergency Medicine

## 2014-03-21 ENCOUNTER — Emergency Department (HOSPITAL_COMMUNITY)
Admission: EM | Admit: 2014-03-21 | Discharge: 2014-03-21 | Disposition: A | Discharge status: Home / Self Care | Payer: 59 | Attending: Emergency Medicine | Admitting: Emergency Medicine

## 2014-03-21 DIAGNOSIS — Y92838 Other recreation area as the place of occurrence of the external cause: Secondary | ICD-10-CM

## 2014-03-21 DIAGNOSIS — Y9367 Activity, basketball: Secondary | ICD-10-CM | POA: Insufficient documentation

## 2014-03-21 DIAGNOSIS — Y9239 Other specified sports and athletic area as the place of occurrence of the external cause: Secondary | ICD-10-CM | POA: Insufficient documentation

## 2014-03-21 DIAGNOSIS — S92309A Fracture of unspecified metatarsal bone(s), unspecified foot, initial encounter for closed fracture: Secondary | ICD-10-CM | POA: Insufficient documentation

## 2014-03-21 DIAGNOSIS — Z8709 Personal history of other diseases of the respiratory system: Secondary | ICD-10-CM | POA: Insufficient documentation

## 2014-03-21 DIAGNOSIS — S92901A Unspecified fracture of right foot, initial encounter for closed fracture: Secondary | ICD-10-CM

## 2014-03-21 DIAGNOSIS — F17200 Nicotine dependence, unspecified, uncomplicated: Secondary | ICD-10-CM | POA: Insufficient documentation

## 2014-03-21 DIAGNOSIS — X500XXA Overexertion from strenuous movement or load, initial encounter: Secondary | ICD-10-CM | POA: Insufficient documentation

## 2014-03-21 MED ORDER — HYDROCODONE-ACETAMINOPHEN 5-325 MG PO TABS
1.0000 | ORAL_TABLET | ORAL | Status: DC | PRN
Start: 2014-03-21 — End: 2014-05-19

## 2014-03-21 NOTE — ED Notes (Signed)
Patient transported to X-ray 

## 2014-03-21 NOTE — ED Notes (Signed)
Pt states was playing basketball yesterday evening when he landed wrong on his R foot, states he thinks he broke it.

## 2014-03-21 NOTE — ED Provider Notes (Signed)
Medical screening examination/treatment/procedure(s) were performed by non-physician practitioner and as supervising physician I was immediately available for consultation/collaboration.   EKG Interpretation None        Devarion Mcclanahan N Harini Dearmond, DO 03/21/14 0707 

## 2014-03-21 NOTE — Discharge Instructions (Signed)
Take vicodin as prescribed for severe pain.   Do not drive within four hours of taking this medication (may cause drowsiness or confusion).  Take up to 800mg  of ibuprofen three times a day for the next 3-4 days (take with food).  Ice 3 times a day for 15-20 minutes.  Elevate when possible to decrease swelling and pain.  Follow up with the orthopedic doctor you have been referred to.  You may return to the ER if your pain worsens or you have any other concerns.

## 2014-03-21 NOTE — ED Notes (Signed)
Pt states he took oxycodone and tylenol at home.

## 2014-03-21 NOTE — ED Provider Notes (Signed)
CSN: [id number]     Arrival date & time 03/21/14  0356 History   First MD Initiated Contact with Patient 03/21/14 0413     Chief Complaint  Patient presents with  . Foot Pain    right     (Consider location/radiation/quality/duration/timing/severity/associated sxs/prior Treatment) HPI History provided by pt.   Pt presents w/ inversion injury R foot sustained when landing a jump during basketball game yesterday evening.  C/o severe pain, aggravated by bearing weight, w/ associated edema and paresthesias.  Has taken percocet for pain w/ some relief.   Past Medical History  Diagnosis Date  . Bronchitis    Past Surgical History  Procedure Laterality Date  . Myringotomy with tube placement     No family history on file. History  Substance Use Topics  . Smoking status: Current Every Day Smoker -- 0.50 packs/day    Types: Cigarettes  . Smokeless tobacco: Never Used  . Alcohol Use: Yes     Comment: Occasionally    Review of Systems  All other systems reviewed and are negative.     Allergies  Review of patient's allergies indicates no known allergies.  Home Medications   Prior to Admission medications   Not on File   BP 118/61  Pulse 77  Temp(Src) 98.6 F (37 C) (Oral)  Resp 18  Ht 6\' 3"  (1.905 m)  Wt 175 lb (79.379 kg)  BMI 21.87 kg/m2  SpO2 98% Physical Exam  Nursing note and vitals reviewed. Constitutional: He is oriented to person, place, and time. He appears well-developed and well-nourished. No distress.  HENT:  Head: Normocephalic and atraumatic.  Eyes:  Normal appearance  Neck: Normal range of motion.  Pulmonary/Chest: Effort normal.  Musculoskeletal: Normal range of motion.  Edema, ecchymosis and tenderness dorsal surface 3rd-5th metatarsals.  No edema or tenderness of ankle.  Pain isolated to lateral foot w/ ROM of ankle.  2+ DP pulse and distal sensation intact.    Neurological: He is alert and oriented to person, place, and time.  Psychiatric: He  has a normal mood and affect. His behavior is normal.    ED Course  Procedures (including critical care time) Labs Review Labs Reviewed - No data to display  Imaging Review Dg Foot Complete Right  03/21/2014   CLINICAL DATA:  inversion injury; 3rd-5th metatarsal edema/ttp  EXAM: RIGHT FOOT COMPLETE - 3+ VIEW  COMPARISON:  None.  FINDINGS: Nondisplaced subcentimeter base of fifth metatarsus apparent avulsion fracture, in alignment. No dislocation. No destructive bony lesions. Soft tissue planes are nonsuspicious.  IMPRESSION: Nondisplaced small base of fifth metatarsus probable avulsion fracture without dislocation.   Electronically Signed   By: Awilda Metroourtnay  Bloomer   On: 03/21/2014 05:14     EKG Interpretation None      MDM   Final diagnoses:  Fracture of right foot    23yo healthy M presents w/ inversion injury R foot.  Edema, ecchymosis and ttp dorsum of 3rd-5th metatarsals on exam.  No NV deficits.  Xray pending. 4:54 AM   Xray shows R 5th metatarsal fx.  Nursing staff provided w/ cam walker.  Pt referred to ortho.  Prescribed vicodin for pain and recommended NSAID, rest, ice, elevation.  5:47 AM     Otilio Miuatherine E Danayah Smyre, PA-C 03/21/14 [phone]

## 2014-04-24 ENCOUNTER — Emergency Department: Payer: Self-pay | Admitting: Emergency Medicine

## 2014-05-05 ENCOUNTER — Emergency Department: Payer: Self-pay | Admitting: Emergency Medicine

## 2014-05-05 LAB — URINALYSIS, COMPLETE
Bacteria: NONE SEEN
Bilirubin,UR: NEGATIVE
Blood: NEGATIVE
Glucose,UR: NEGATIVE mg/dL (ref 0–75)
KETONE: NEGATIVE
Leukocyte Esterase: NEGATIVE
Nitrite: NEGATIVE
PH: 6 (ref 4.5–8.0)
Protein: NEGATIVE
Specific Gravity: 1.028 (ref 1.003–1.030)
Squamous Epithelial: NONE SEEN
WBC UR: 3 /HPF (ref 0–5)

## 2014-05-18 ENCOUNTER — Emergency Department: Payer: Self-pay | Admitting: Emergency Medicine

## 2014-05-19 ENCOUNTER — Encounter (HOSPITAL_COMMUNITY): Payer: Self-pay | Admitting: Emergency Medicine

## 2014-05-19 ENCOUNTER — Emergency Department (HOSPITAL_COMMUNITY)
Admission: EM | Admit: 2014-05-19 | Discharge: 2014-05-19 | Disposition: A | Discharge status: Home / Self Care | Payer: 59 | Attending: Emergency Medicine | Admitting: Emergency Medicine

## 2014-05-19 DIAGNOSIS — F17200 Nicotine dependence, unspecified, uncomplicated: Secondary | ICD-10-CM | POA: Insufficient documentation

## 2014-05-19 DIAGNOSIS — G479 Sleep disorder, unspecified: Secondary | ICD-10-CM | POA: Insufficient documentation

## 2014-05-19 MED ORDER — DOXYLAMINE SUCCINATE (SLEEP) 25 MG PO TABS: 25.0000 mg | ORAL_TABLET | Freq: Every evening | ORAL | Status: DC | PRN

## 2014-05-19 NOTE — ED Notes (Signed)
Pt states he has a swollen area to side of his R ear. He thinks a bug bit him there. No deformity noted. Pt also states he has "insomnia since I was little.". Pt states now it is affecting his job. Pt alert, no acute distress.

## 2014-05-19 NOTE — ED Provider Notes (Signed)
CSN: [id number]     Arrival date & time 05/19/14  [id number] History  This chart was scribed for Antony MaduraKelly Kashawn Dirr, PA-C, non-physician practitioner working with Shanna CiscoMegan E Docherty, MD by Nicholos Johnsenise Iheanachor, ED scribe. This patient was seen in room WTR6/WTR6 and the patient's care was started at 8:36 PM.    Chief Complaint  Patient presents with  . Insect Bite  . Insomnia    The history is provided by the patient. No language interpreter was used.   HPI Comments: Scott PoissonJesse Carlson is a 23 y.o. male who presents to the Emergency Department complaining of a boil on the outside of the right ear; onset approximately 1 month. Believes it may be an insect bite. States its been gone for the past 2 weeks but does report some intermittent tenderness still present. States he just wants to make sure there is nothing wrong or infection needing antibiotics treatment. No fever, pus drainage, trouble hearing, or neck stiffness.   Also reports difficulty sleeping since he was an adolescent. Has never seen a doctor for this trouble. States it is now interfering with his work schedule. Will only get 1 hour of sleep before having to work in the morning. States this has caused him to miss some work. Pt was asleep upon entering examination room. States he has tried OTC sleep medications with no relief. Has not tried Unisom.    Past Medical History  Diagnosis Date  . Bronchitis    Past Surgical History  Procedure Laterality Date  . Myringotomy with tube placement     No family history on file. History  Substance Use Topics  . Smoking status: Current Every Day Smoker -- 0.50 packs/day    Types: Cigarettes  . Smokeless tobacco: Never Used  . Alcohol Use: Yes     Comment: Occasionally    Review of Systems  Constitutional: Negative for fever and chills.  HENT: Negative for ear discharge and hearing loss.   Musculoskeletal: Negative for neck stiffness.    Allergies  Review of patient's allergies indicates no known  allergies.  Home Medications   Prior to Admission medications   Medication Sig Start Date End Date Taking? Authorizing Provider  doxylamine, Sleep, (UNISOM) 25 MG tablet Take 1 tablet (25 mg total) by mouth at bedtime as needed. 05/19/14   Antony MaduraKelly Albirda Shiel, PA-C   Triage vitals: BP 121/54  Pulse 84  Temp(Src) 98.3 F (36.8 C) (Oral)  Resp 20  Ht 6\' 3"  (1.905 m)  Wt 173 lb (78.472 kg)  BMI 21.62 kg/m2  SpO2 100%  Physical Exam  Nursing note and vitals reviewed. Constitutional: He is oriented to person, place, and time. He appears well-developed and well-nourished. No distress.  Nontoxic/nonseptic appearing; sleeping on initial presentation to exam room.  HENT:  Head: Normocephalic and atraumatic.  Right Ear: Hearing, tympanic membrane, external ear and ear canal normal. No swelling or tenderness. No mastoid tenderness. No decreased hearing is noted.  Left Ear: Hearing, tympanic membrane, external ear and ear canal normal. No swelling or tenderness. No mastoid tenderness. No decreased hearing is noted.  Nose: Nose normal.  Mouth/Throat: Uvula is midline, oropharynx is clear and moist and mucous membranes are normal.  Eyes: Conjunctivae and EOM are normal. Pupils are equal, round, and reactive to light. No scleral icterus.  Neck: Normal range of motion. Neck supple.  No nuchal rigidity or meningismus  Cardiovascular: Normal rate, regular rhythm and normal heart sounds.   Pulmonary/Chest: Effort normal and breath sounds normal. No stridor. No  respiratory distress. He has no wheezes. He has no rales.  Musculoskeletal: Normal range of motion.  Neurological: He is alert and oriented to person, place, and time.  GCS 15. Patient moves extremities without ataxia. Speech is goal oriented.  Skin: Skin is warm and dry. No rash noted. He is not diaphoretic. No erythema. No pallor.  Psychiatric: He has a normal mood and affect. His behavior is normal.    ED Course  Procedures (including critical  care time) DIAGNOSTIC STUDIES: Oxygen Saturation is 100% on room air, normal by my interpretation.    COORDINATION OF CARE: At 8:40 PM: Discussed treatment plan with patient which includes monitoring for ear boil and referral to primary care for trouble sleeping. Patient agrees.   Labs Review Labs Reviewed - No data to display  Imaging Review No results found.   EKG Interpretation None      MDM   Final diagnoses:  Sleep disturbance   Patient presents to the emergency department for right ear swelling. Swelling has resolved x2 weeks and patient has no evidence of mastoiditis, cellulitis, or abscess today. No evidence that further workup or evaluation is indicated at this time. Patient also complaining of insomnia which is now interfering with his daily life. Patient has had insomnia since he was an adolescent. Believe this chronic condition is better suited to be managed by a primary care provider. Have recommended use of over-the-counter Unisom. Resource guide and referral provided for primary care. Patient afebrile, hemodynamically stable and appropriate for discharge. Return precautions provided and patient agreeable to plan with no unaddressed concerns.  I personally performed the services described in this documentation, which was scribed in my presence. The recorded information has been reviewed and is accurate.    Filed Vitals:   05/19/14 1938  BP: 121/54  Pulse: 84  Temp: 98.3 F (36.8 C)  TempSrc: Oral  Resp: 20  Height: 6\' 3"  (1.905 m)  Weight: 173 lb (78.472 kg)  SpO2: 100%     Antony MaduraKelly Raye Slyter, PA-C 05/20/14 1855

## 2014-05-19 NOTE — Discharge Instructions (Signed)

## 2014-05-22 NOTE — ED Provider Notes (Signed)
Medical screening examination/treatment/procedure(s) were performed by non-physician practitioner and as supervising physician I was immediately available for consultation/collaboration.   Shanna CiscoMegan E Marilla Boddy, MD 05/22/14 [phone]

## 2014-06-01 ENCOUNTER — Encounter (HOSPITAL_COMMUNITY): Payer: Self-pay | Admitting: Emergency Medicine

## 2014-06-01 ENCOUNTER — Emergency Department (HOSPITAL_COMMUNITY)
Admission: EM | Admit: 2014-06-01 | Discharge: 2014-06-01 | Disposition: A | Discharge status: Home / Self Care | Payer: 59 | Attending: Emergency Medicine | Admitting: Emergency Medicine

## 2014-06-01 DIAGNOSIS — F17200 Nicotine dependence, unspecified, uncomplicated: Secondary | ICD-10-CM | POA: Insufficient documentation

## 2014-06-01 DIAGNOSIS — Z8709 Personal history of other diseases of the respiratory system: Secondary | ICD-10-CM | POA: Insufficient documentation

## 2014-06-01 DIAGNOSIS — B86 Scabies: Secondary | ICD-10-CM | POA: Insufficient documentation

## 2014-06-01 MED ORDER — PERMETHRIN 5 % EX CREA: TOPICAL_CREAM | CUTANEOUS | Status: DC

## 2014-06-01 NOTE — ED Notes (Signed)
Pt states he has a rash for about a week  Pt states he thinks he has scabies  Pt states has had them in the past

## 2014-06-01 NOTE — ED Provider Notes (Signed)
CSN: [id number]     Arrival date & time 06/01/14  2227 History   First MD Initiated Contact with Patient 06/01/14 2311     Chief Complaint  Patient presents with  . Rash     (Consider location/radiation/quality/duration/timing/severity/associated sxs/prior Treatment) HPI Patient presents to the emergency department for evaluation of rash that is diffuse and itchy. He reports that it started approximately a week ago and has been spreading. He believes that it is scabies and due to dirty linen as he has had this in the past. He denies seeing any insects. He denies any fevers, nausea, vomiting, diarrhea, weakness. The rash is worse to bilateral forearms and hands.   Past Medical History  Diagnosis Date  . Bronchitis    Past Surgical History  Procedure Laterality Date  . Myringotomy with tube placement     Family History  Problem Relation Age of Onset  . Hypertension Other   . CAD Other   . Cancer Other    History  Substance Use Topics  . Smoking status: Current Every Day Smoker -- 0.50 packs/day    Types: Cigarettes  . Smokeless tobacco: Never Used  . Alcohol Use: Yes     Comment: Occasionally    Review of Systems   Review of Systems  Gen: no weight loss, fevers, chills, night sweats  Eyes: no discharge or drainage, no occular pain or visual changes  Nose: no epistaxis or rhinorrhea  Mouth: no dental pain, no sore throat  Neck: no neck pain  Lungs:No wheezing, coughing or hemoptysis CV: no chest pain, palpitations, dependent edema or orthopnea  Abd: no abdominal pain, nausea, vomiting, diarrhea GU: no dysuria or gross hematuria  MSK:  No muscle weakness or pain Neuro: no headache, no focal neurologic deficits  Skin: + rash no wounds Psyche: no complaints    Allergies  Review of patient's allergies indicates no known allergies.  Home Medications   Prior to Admission medications   Medication Sig Start Date End Date Taking? Authorizing Provider  doxylamine,  Sleep, (UNISOM) 25 MG tablet Take 1 tablet (25 mg total) by mouth at bedtime as needed. 05/19/14  Yes Antony MaduraKelly Humes, PA-C  permethrin (ELIMITE) 5 % cream Apply to affected area once 06/01/14   Dorthula Matasiffany G Greene, PA-C   BP 114/58  Pulse 83  Temp(Src) 98.6 F (37 C) (Oral)  Resp 14  Ht 6\' 3"  (1.905 m)  Wt 166 lb (75.297 kg)  BMI 20.75 kg/m2  SpO2 97% Physical Exam  Nursing note and vitals reviewed. Constitutional: He appears well-developed and well-nourished. No distress.  HENT:  Head: Normocephalic and atraumatic.  Eyes: Pupils are equal, round, and reactive to light.  Neck: Normal range of motion. Neck supple.  Cardiovascular: Normal rate and regular rhythm.   Pulmonary/Chest: Effort normal.  Abdominal: Soft.  Neurological: He is alert.  Skin: Skin is warm and dry. No lesion noted.  Rash to bilateral arms, spares rest of body. Tiny papules without pustules, vesicles, purpura or urticaria.    ED Course  Procedures (including critical care time) Labs Review Labs Reviewed - No data to display  Imaging Review No results found.   EKG Interpretation None      MDM   Final diagnoses:  Scabies    Rx: perimethrin cream  23 y.o.Scott Carlson's evaluation in the Emergency Department is complete. It has been determined that no acute conditions requiring further emergency intervention are present at this time. The patient/guardian have been advised of the diagnosis  and plan. We have discussed signs and symptoms that warrant return to the ED, such as changes or worsening in symptoms.  Vital signs are stable at discharge. Filed Vitals:   06/01/14 2255  BP: 114/58  Pulse: 83  Temp: 98.6 F (37 C)  Resp: 14    Patient/guardian has voiced understanding and agreed to follow-up with the PCP or specialist.     Dorthula Matasiffany G Greene, PA-C 06/01/14 2326

## 2014-06-01 NOTE — Discharge Instructions (Signed)

## 2014-06-02 NOTE — ED Provider Notes (Signed)
Medical screening examination/treatment/procedure(s) were performed by non-physician practitioner and as supervising physician I was immediately available for consultation/collaboration.   EKG Interpretation None        Brandt LoosenJulie Manly, MD 06/02/14 2329

## 2014-07-30 ENCOUNTER — Encounter (HOSPITAL_COMMUNITY): Payer: Self-pay | Admitting: Emergency Medicine

## 2014-07-30 ENCOUNTER — Emergency Department (HOSPITAL_COMMUNITY)
Admission: EM | Admit: 2014-07-30 | Discharge: 2014-07-31 | Disposition: A | Discharge status: Home / Self Care | Payer: 59 | Attending: Emergency Medicine | Admitting: Emergency Medicine

## 2014-07-30 ENCOUNTER — Emergency Department (HOSPITAL_COMMUNITY): Payer: 59

## 2014-07-30 DIAGNOSIS — S6390XA Sprain of unspecified part of unspecified wrist and hand, initial encounter: Secondary | ICD-10-CM | POA: Diagnosis not present

## 2014-07-30 DIAGNOSIS — Y9289 Other specified places as the place of occurrence of the external cause: Secondary | ICD-10-CM | POA: Diagnosis not present

## 2014-07-30 DIAGNOSIS — Y9389 Activity, other specified: Secondary | ICD-10-CM | POA: Diagnosis not present

## 2014-07-30 DIAGNOSIS — S6392XA Sprain of unspecified part of left wrist and hand, initial encounter: Secondary | ICD-10-CM

## 2014-07-30 DIAGNOSIS — F17200 Nicotine dependence, unspecified, uncomplicated: Secondary | ICD-10-CM | POA: Insufficient documentation

## 2014-07-30 DIAGNOSIS — S6990XA Unspecified injury of unspecified wrist, hand and finger(s), initial encounter: Secondary | ICD-10-CM | POA: Insufficient documentation

## 2014-07-30 DIAGNOSIS — R296 Repeated falls: Secondary | ICD-10-CM | POA: Insufficient documentation

## 2014-07-30 NOTE — ED Notes (Signed)
Pt states he fell about 3 weeks ago and injured his left hand  Pt states he continues to have pain in it

## 2014-07-31 ENCOUNTER — Telehealth (HOSPITAL_BASED_OUTPATIENT_CLINIC_OR_DEPARTMENT_OTHER): Payer: Self-pay | Admitting: Emergency Medicine

## 2014-07-31 MED ORDER — MELOXICAM 15 MG PO TABS: 15.0000 mg | ORAL_TABLET | Freq: Every day | ORAL | Status: DC

## 2014-07-31 NOTE — Discharge Instructions (Signed)
Your x-rays did not show any broken bones or other concerning injury. Use rest, ice, compression and elevation to reduce pain and swelling. Followup with a primary care provider for continued evaluation and treatment.    Finger Sprain A finger sprain is a tear in one of the strong, fibrous tissues that connect the bones (ligaments) in your finger. The severity of the sprain depends on how much of the ligament is torn. The tear can be either partial or complete. CAUSES  Often, sprains are a result of a fall or accident. If you extend your hands to catch an object or to protect yourself, the force of the impact causes the fibers of your ligament to stretch too much. This excess tension causes the fibers of your ligament to tear. SYMPTOMS  You may have some loss of motion in your finger. Other symptoms include:  Bruising.  Tenderness.  Swelling. DIAGNOSIS  In order to diagnose finger sprain, your caregiver will physically examine your finger or thumb to determine how torn the ligament is. Your caregiver may also suggest an X-ray exam of your finger to make sure no bones are broken. TREATMENT  If your ligament is only partially torn, treatment usually involves keeping the finger in a fixed position (immobilization) for a short period. To do this, your caregiver will apply a bandage, cast, or splint to keep your finger from moving until it heals. For a partially torn ligament, the healing process usually takes 2 to 3 weeks. If your ligament is completely torn, you may need surgery to reconnect the ligament to the bone. After surgery a cast or splint will be applied and will need to stay on your finger or thumb for 4 to 6 weeks while your ligament heals. HOME CARE INSTRUCTIONS  Keep your injured finger elevated, when possible, to decrease swelling.  To ease pain and swelling, apply ice to your joint twice a day, for 2 to 3 days:  Put ice in a plastic bag.  Place a towel between your skin and  the bag.  Leave the ice on for 15 minutes.  Only take over-the-counter or prescription medicine for pain as directed by your caregiver.  Do not wear rings on your injured finger.  Do not leave your finger unprotected until pain and stiffness go away (usually 3 to 4 weeks).  Do not allow your cast or splint to get wet. Cover your cast or splint with a plastic bag when you shower or bathe. Do not swim.  Your caregiver may suggest special exercises for you to do during your recovery to prevent or limit permanent stiffness. SEEK IMMEDIATE MEDICAL CARE IF:  Your cast or splint becomes damaged.  Your pain becomes worse rather than better. MAKE SURE YOU:  Understand these instructions.  Will watch your condition.  Will get help right away if you are not doing well or get worse. Document Released: 12/31/2004 Document Revised: 02/15/2012 Document Reviewed: 07/27/2011 [facility] Patient Information 2015 [place], [place]. This information is not intended to replace advice given to you by your health care provider. Make sure you discuss any questions you have with your health care provider.

## 2014-07-31 NOTE — ED Provider Notes (Signed)
CSN: [id number]     Arrival date & time 07/30/14  2233 History   First MD Initiated Contact with Patient 07/31/14 0019     Chief Complaint  Patient presents with  . Hand Pain    HPI  Hx provided by the pt. Pt is a 23 yo male with no sig PMH who presents with continued pain in left hand after fall 2-3 weeks ago. Pt reports being intoxicated and falling onto his left hand since then he has had continued pain around 3 and 4th mcp joints. Pain is worse with movements. No deformity or swelling. He has not used any treatments. No prior hx of hand injuries. No weakness or numbness.    Past Medical History  Diagnosis Date  . Bronchitis    Past Surgical History  Procedure Laterality Date  . Myringotomy with tube placement     Family History  Problem Relation Age of Onset  . Hypertension Other   . CAD Other   . Cancer Other   . Diabetes Other   . CAD Mother    History  Substance Use Topics  . Smoking status: Current Every Day Smoker -- 0.50 packs/day    Types: Cigarettes  . Smokeless tobacco: Never Used  . Alcohol Use: Yes     Comment: Occasionally    Review of Systems  Neurological: Negative for weakness and numbness.  All other systems reviewed and are negative.     Allergies  Review of patient's allergies indicates no known allergies.  Home Medications   Prior to Admission medications   Medication Sig Start Date End Date Taking? Authorizing Provider  doxylamine, Sleep, (UNISOM) 25 MG tablet Take 1 tablet (25 mg total) by mouth at bedtime as needed. 05/19/14   Antony Madura, PA-C  permethrin (ELIMITE) 5 % cream Apply to affected area once 06/01/14   Dorthula Matas, PA-C   There were no vitals taken for this visit. Physical Exam  Nursing note and vitals reviewed. Constitutional: He is oriented to person, place, and time. He appears well-developed and well-nourished. No distress.  HENT:  Head: Normocephalic.  Cardiovascular: Normal rate and regular rhythm.    Pulmonary/Chest: Effort normal and breath sounds normal. No respiratory distress.  Musculoskeletal:  Full ROM of left fingers and wrist. Normal strength in all directions. No deformities or swelling. No skin changes. Normal sensations to light touch.   Neurological: He is alert and oriented to person, place, and time.  Skin: Skin is warm.  Psychiatric: He has a normal mood and affect.    ED Course  Procedures   COORDINATION OF CARE:  Nursing notes reviewed. Vital signs reviewed. Initial pt interview and examination performed.   Filed Vitals:   07/31/14 0053  BP: 111/64  Pulse: 86  Temp: 98.9 F (37.2 C)  TempSrc: Oral  Resp: 18  SpO2: 100%    12:51 AM-Pt seen and evaluated. X-rays reviewed with pt. No signs of fx or other concerning injuries. Pt with possible sprain with continued discomforts. Seems mild. Discussed treatment plan with RICE. He agrees.     Imaging Review Dg Hand Complete Left  07/30/2014   CLINICAL DATA:  Left hand pain since an injury a few weeks ago.  EXAM: LEFT HAND - COMPLETE 3+ VIEW  COMPARISON:  None.  FINDINGS: There is no evidence of fracture or dislocation. There is no evidence of arthropathy or other focal bone abnormality. Soft tissues are unremarkable.  IMPRESSION: Normal examination.   Electronically Signed   By:  Gordan Payment M.D.   On: 07/30/2014 23:22    MDM   Final diagnoses:  Hand sprain, left, initial encounter        Angus Seller, PA-C 07/31/14 [phone]

## 2014-08-02 NOTE — ED Provider Notes (Signed)
Medical screening examination/treatment/procedure(s) were performed by non-physician practitioner and as supervising physician I was immediately available for consultation/collaboration.   EKG Interpretation None        Albi Rappaport M Cammy Sanjurjo, MD 08/02/14 0731 

## 2016-03-16 ENCOUNTER — Emergency Department (HOSPITAL_COMMUNITY)
Admission: EM | Admit: 2016-03-16 | Discharge: 2016-03-16 | Disposition: A | Discharge status: Home / Self Care | Payer: 59 | Attending: Emergency Medicine | Admitting: Emergency Medicine

## 2016-03-16 ENCOUNTER — Encounter (HOSPITAL_COMMUNITY): Payer: Self-pay | Admitting: Emergency Medicine

## 2016-03-16 ENCOUNTER — Emergency Department (HOSPITAL_COMMUNITY): Payer: 59

## 2016-03-16 DIAGNOSIS — S6991XA Unspecified injury of right wrist, hand and finger(s), initial encounter: Secondary | ICD-10-CM | POA: Diagnosis not present

## 2016-03-16 DIAGNOSIS — Y9361 Activity, american tackle football: Secondary | ICD-10-CM | POA: Insufficient documentation

## 2016-03-16 DIAGNOSIS — Z8709 Personal history of other diseases of the respiratory system: Secondary | ICD-10-CM | POA: Diagnosis not present

## 2016-03-16 DIAGNOSIS — Y9289 Other specified places as the place of occurrence of the external cause: Secondary | ICD-10-CM | POA: Diagnosis not present

## 2016-03-16 DIAGNOSIS — Y998 Other external cause status: Secondary | ICD-10-CM | POA: Insufficient documentation

## 2016-03-16 DIAGNOSIS — F17210 Nicotine dependence, cigarettes, uncomplicated: Secondary | ICD-10-CM | POA: Insufficient documentation

## 2016-03-16 DIAGNOSIS — S60041A Contusion of right ring finger without damage to nail, initial encounter: Secondary | ICD-10-CM | POA: Diagnosis not present

## 2016-03-16 DIAGNOSIS — M79644 Pain in right finger(s): Secondary | ICD-10-CM | POA: Diagnosis not present

## 2016-03-16 DIAGNOSIS — W2209XA Striking against other stationary object, initial encounter: Secondary | ICD-10-CM | POA: Diagnosis not present

## 2016-03-16 DIAGNOSIS — Z791 Long term (current) use of non-steroidal anti-inflammatories (NSAID): Secondary | ICD-10-CM | POA: Diagnosis not present

## 2016-03-16 MED ORDER — NAPROXEN 500 MG PO TABS
500.0000 mg | ORAL_TABLET | Freq: Once | ORAL | Status: AC
Start: 2016-03-16 — End: 2016-03-16
  Administered 2016-03-16: 500 mg via ORAL
  Filled 2016-03-16: qty 1

## 2016-03-16 MED ORDER — NAPROXEN 500 MG PO TABS: 500.0000 mg | ORAL_TABLET | Freq: Two times a day (BID) | ORAL | Status: DC

## 2016-03-16 NOTE — Discharge Instructions (Signed)
Your x-ray showed some swelling but no evidence of fracture or dislocation. You may use the splint as needed for compression and stabilization. Otherwise, ice on and off for the next 48 hours to help with pain and swelling. Take naproxen as needed which will also help with pain and swelling. Keep your hand elevated when resting at home. Call Dr. Kathline MagicSwinteck's office for follow up if your pain worsens or does not improve.

## 2016-03-16 NOTE — ED Provider Notes (Signed)
CSN: [id number]     Arrival date & time 03/16/16  1323 History  By signing my name below, I, Placido Sou, attest that this documentation has been prepared under the direction and in the presence of Vasily Fedewa Y Javontae Marlette, [place]. Electronically Signed: Placido Sou, ED Scribe. 03/16/2016. 2:01 PM.   Chief Complaint  Patient presents with  . Finger Injury   The history is provided by the patient. No language interpreter was used.    HPI Comments: Scott Carlson is a 25 y.o. male who presents to the Emergency Department complaining of constant, mild, sudden onset, right 4th digit pain x 1 day. He reports playing football and accidentally jammed the affected finger. His pain worsens with movement. He denies taking any medications for his pain. He denies any other associated symptoms at this time.   Past Medical History  Diagnosis Date  . Bronchitis    Past Surgical History  Procedure Laterality Date  . Myringotomy with tube placement     Family History  Problem Relation Age of Onset  . Hypertension Other   . CAD Other   . Cancer Other   . Diabetes Other   . CAD Mother    Social History  Substance Use Topics  . Smoking status: Current Every Day Smoker -- 0.50 packs/day    Types: Cigarettes  . Smokeless tobacco: Never Used  . Alcohol Use: Yes     Comment: Occasionally    Review of Systems A complete 10 system review of systems was obtained and all systems are negative except as noted in the HPI and PMH.   Allergies  Review of patient's allergies indicates no known allergies.  Home Medications   Prior to Admission medications   Medication Sig Start Date End Date Taking? Authorizing Provider  meloxicam (MOBIC) 15 MG tablet Take 1 tablet (15 mg total) by mouth daily. 07/31/14   Ivonne Andrew, PA-C   BP 116/63 mmHg  Pulse 72  Temp(Src) 98.2 F (36.8 C) (Oral)  Resp 16  SpO2 96% Physical Exam  Constitutional: He is oriented to person, place, and time. He appears  well-developed and well-nourished.  HENT:  Head: Normocephalic and atraumatic.  Eyes: EOM are normal.  Neck: Normal range of motion.  Cardiovascular: Normal rate.   Pulmonary/Chest: Effort normal. No respiratory distress.  Abdominal: Soft.  Musculoskeletal: Normal range of motion.  Tenderness and mild edema along ulnar right index finger in DIP region. FROM. Brisk cap refill.   Neurological: He is alert and oriented to person, place, and time.  Skin: Skin is warm and dry.  Psychiatric: He has a normal mood and affect.  Nursing note and vitals reviewed.  ED Course  Procedures  DIAGNOSTIC STUDIES: Oxygen Saturation is 96% on RA, normal by my interpretation.    COORDINATION OF CARE: 2:01 PM Discussed next steps with pt. He verbalized understanding and is agreeable with the plan.   Labs Review Labs Reviewed - No data to display  Imaging Review Dg Finger Ring Right  03/16/2016  CLINICAL DATA:  Injured RIGHT ring finger yesterday playing basketball, DIP joint pain, initial encounter EXAM: RIGHT RING FINGER 2+V COMPARISON:  RIGHT hand radiographs 11/23/2007 FINDINGS: Soft tissue swelling at DIP joint particularly ulnar margin. Osseous mineralization normal. Joint spaces preserved. No acute fracture, dislocation or bone destruction. IMPRESSION: No acute osseous abnormalities. Electronically Signed   By: Ulyses Southward M.D.   On: 03/16/2016 14:16   I have personally reviewed and evaluated these images as part of my  medical decision-making.   EKG Interpretation None      MDM   Final diagnoses:  Contusion of right ring finger, initial encounter    X-ray negative. Given splint for compression and stability per pt request. Instructed RICE therapy and NSAIDs as needed. Rx for naproxen given. Instructed to f/u with ortho if symptoms persist. ER return precautions given.   I personally performed the services described in this documentation, which was scribed in my presence. The recorded  information has been reviewed and is accurate.   Carlene CoriaSerena Y Jazsmin Couse, PA-C 03/16/16 1708  Samuel JesterKathleen McManus, DO 03/20/16 [phone]

## 2016-03-16 NOTE — ED Notes (Signed)
Patient transported to X-ray 

## 2016-03-16 NOTE — ED Notes (Signed)
Pt injured his Right ring finger playing ball.

## 2016-07-20 ENCOUNTER — Encounter (HOSPITAL_COMMUNITY): Payer: Self-pay | Admitting: Emergency Medicine

## 2016-07-20 ENCOUNTER — Emergency Department (HOSPITAL_COMMUNITY)
Admission: EM | Admit: 2016-07-20 | Discharge: 2016-07-21 | Disposition: A | Discharge status: Home / Self Care | Payer: 59 | Attending: Dermatology | Admitting: Dermatology

## 2016-07-20 DIAGNOSIS — M545 Low back pain: Secondary | ICD-10-CM | POA: Insufficient documentation

## 2016-07-20 DIAGNOSIS — Z5321 Procedure and treatment not carried out due to patient leaving prior to being seen by health care provider: Secondary | ICD-10-CM | POA: Insufficient documentation

## 2016-07-20 NOTE — ED Notes (Signed)
Name was called for room placement no answer.

## 2016-07-20 NOTE — ED Triage Notes (Signed)
Pt reports left lower back pain. Denies injury or GU symptoms.

## 2016-07-20 NOTE — ED Notes (Signed)
Pt called for room, no answer.

## 2016-07-21 DIAGNOSIS — M545 Low back pain: Secondary | ICD-10-CM | POA: Diagnosis not present

## 2018-05-03 ENCOUNTER — Emergency Department
Admission: EM | Admit: 2018-05-03 | Discharge: 2018-05-03 | Disposition: A | Discharge status: Home / Self Care | Payer: 59 | Attending: Emergency Medicine | Admitting: Emergency Medicine

## 2018-05-03 ENCOUNTER — Encounter: Payer: Self-pay | Admitting: Emergency Medicine

## 2018-05-03 ENCOUNTER — Other Ambulatory Visit: Payer: Self-pay

## 2018-05-03 DIAGNOSIS — F17210 Nicotine dependence, cigarettes, uncomplicated: Secondary | ICD-10-CM | POA: Insufficient documentation

## 2018-05-03 DIAGNOSIS — Z79899 Other long term (current) drug therapy: Secondary | ICD-10-CM | POA: Insufficient documentation

## 2018-05-03 DIAGNOSIS — F419 Anxiety disorder, unspecified: Secondary | ICD-10-CM | POA: Insufficient documentation

## 2018-05-03 DIAGNOSIS — B86 Scabies: Secondary | ICD-10-CM | POA: Insufficient documentation

## 2018-05-03 MED ORDER — TRIAMCINOLONE ACETONIDE 0.1 % EX CREA: 1.0000 "application " | TOPICAL_CREAM | Freq: Four times a day (QID) | CUTANEOUS | 0 refills | Status: DC

## 2018-05-03 MED ORDER — PERMETHRIN 5 % EX CREA: 1.0000 "application " | TOPICAL_CREAM | Freq: Once | CUTANEOUS | 0 refills | Status: AC

## 2018-05-03 NOTE — ED Triage Notes (Signed)
Presents with rash to hands/arms for the past 1-2 weeks

## 2018-05-03 NOTE — ED Provider Notes (Signed)
[facility] Emergency Department Provider Note  ____________________________________________  Time seen: Approximately 5:49 PM  I have reviewed the triage vital signs and the nursing notes.   HISTORY  Chief Complaint Rash    HPI Scott Carlson is a 27 y.o. male who presents the emergency department complaining of papular rash to bilateral hands and forearms.  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  Patient reports that he had scabies 3 years ago with similar symptoms and rash.  Patient reports areas are extremely pruritic.  No other complaints at this time.  No medications for this complaint prior to arrival.  Past Medical History:  Diagnosis Date  . Bronchitis     Patient Active Problem List   Diagnosis Date Noted  . Mood disorder (HCC) 04/27/2013  . ANXIETY 02/21/2010    Past Surgical History:  Procedure Laterality Date  . MYRINGOTOMY WITH TUBE PLACEMENT      Prior to Admission medications   Medication Sig Start Date End Date Taking? Authorizing Provider  meloxicam (MOBIC) 15 MG tablet Take 1 tablet (15 mg total) by mouth daily. 07/31/14   Ivonne Andrew, PA-C  naproxen (NAPROSYN) 500 MG tablet Take 1 tablet (500 mg total) by mouth 2 (two) times daily. 03/16/16   Sam, Ace Gins, PA-C  permethrin (ELIMITE) 5 % cream Apply 1 application topically once for 1 dose. 05/03/18 05/03/18  Laila Myhre, Delorise Royals, PA-C  triamcinolone cream (KENALOG) 0.1 % Apply 1 application topically 4 (four) times daily. 05/03/18   Kaislyn Gulas, Delorise Royals, PA-C    Allergies Patient has no known allergies.  Family History  Problem Relation Age of Onset  . Hypertension Other   . CAD Other   . Cancer Other   . Diabetes Other   . CAD Mother     Social History Social History   Tobacco Use  . Smoking status: Current Every Day Smoker    Packs/day: 0.25    Types: Cigarettes  . Smokeless tobacco: Never Used   Substance Use Topics  . Alcohol use: Yes    Comment: Occasionally  . Drug use: No    Types: Marijuana    Comment: stopped 5 months ago      Review of Systems  Constitutional: No fever/chills Eyes: No visual changes. No discharge ENT: No upper respiratory complaints. Cardiovascular: no chest pain. Respiratory: no cough. No SOB. Gastrointestinal: No abdominal pain.  No nausea, no vomiting. Musculoskeletal: Negative for musculoskeletal pain. Skin: Positive for rash to bilateral hands and forearms Neurological: Negative for headaches, focal weakness or numbness. 10-point ROS otherwise negative.  ____________________________________________   PHYSICAL EXAM:  VITAL SIGNS: ED Triage Vitals  Enc Vitals Group     BP 05/03/18 1658 113/60     Pulse Rate 05/03/18 1658 70     Resp 05/03/18 1658 18     Temp 05/03/18 1658 98.2 F (36.8 C)     Temp Source 05/03/18 1658 Oral     SpO2 05/03/18 1658 98 %     Weight 05/03/18 1701 170 lb (77.1 kg)     Height 05/03/18 1701  (1.905 m)     Head Circumference --      Peak Flow --      Pain Score --      Pain Loc --      Pain Edu? --      Excl. in GC? --      Constitutional: Alert and oriented.  Well appearing and in no acute distress. Eyes: Conjunctivae are normal. PERRL. EOMI. Head: Atraumatic. Neck: No stridor.    Cardiovascular: Normal rate, regular rhythm. Normal S1 and S2.  Good peripheral circulation. Respiratory: Normal respiratory effort without tachypnea or retractions. Lungs CTAB. Good air entry to the bases with no decreased or absent breath sounds. Musculoskeletal: Full range of motion to all extremities. No gross deformities appreciated. Neurologic:  Normal speech and language. No gross focal neurologic deficits are appreciated.  Skin:  Skin is warm, dry and intact.  Positive for papular rash scattered to both hands, extending into the forearms.  Excoriations consistent with scabies.  Rash is also appreciated in the  interdigital space.  No surrounding erythema or edema consistent with cellulitis.  No other rash. Psychiatric: Mood and affect are normal. Speech and behavior are normal. Patient exhibits appropriate insight and judgement.   ____________________________________________   LABS (all labs ordered are listed, but only abnormal results are displayed)  Labs Reviewed - No data to display ____________________________________________  EKG   ____________________________________________  RADIOLOGY   No results found.  ____________________________________________    PROCEDURES  Procedure(s) performed:    Procedures    Medications - No data to display   ____________________________________________   INITIAL IMPRESSION / ASSESSMENT AND PLAN / ED COURSE  Pertinent labs & imaging results that were available during my care of the patient were reviewed by me and considered in my medical decision making (see chart for details).  Review of the  [place]RS was performed in accordance of the [facility] prior to dispensing any controlled drugs.     Patient's diagnosis is consistent with scabies.  Patient presents with rash consistent with scabies.  Patient will be discharged with a 5% permethrin topical treatment as well as triamcinolone for symptom control..  Patient will follow primary care as needed.  Patient is given ED precautions to return to the ED for any worsening or new symptoms.     ____________________________________________  FINAL CLINICAL IMPRESSION(S) / ED DIAGNOSES  Final diagnoses:  Scabies      NEW MEDICATIONS STARTED DURING THIS VISIT:  ED Discharge Orders        Ordered    permethrin (ELIMITE) 5 % cream   Once     05/03/18 1758    triamcinolone cream (KENALOG) 0.1 %  4 times daily     05/03/18 1758          This chart was dictated using voice recognition software/Dragon. Despite best efforts to proofread, errors can occur which can change the  meaning. Any change was purely unintentional.    Racheal Patches, PA-C 05/03/18 1805    Rockne Menghini, MD 05/04/18 [phone]

## 2018-08-17 ENCOUNTER — Emergency Department: Payer: Self-pay

## 2018-08-17 ENCOUNTER — Encounter: Payer: Self-pay | Admitting: Emergency Medicine

## 2018-08-17 ENCOUNTER — Emergency Department
Admission: EM | Admit: 2018-08-17 | Discharge: 2018-08-17 | Disposition: A | Discharge status: Home / Self Care | Payer: Self-pay | Attending: Emergency Medicine | Admitting: Emergency Medicine

## 2018-08-17 ENCOUNTER — Other Ambulatory Visit: Payer: Self-pay

## 2018-08-17 DIAGNOSIS — Z79899 Other long term (current) drug therapy: Secondary | ICD-10-CM | POA: Insufficient documentation

## 2018-08-17 DIAGNOSIS — B9789 Other viral agents as the cause of diseases classified elsewhere: Secondary | ICD-10-CM

## 2018-08-17 DIAGNOSIS — F17210 Nicotine dependence, cigarettes, uncomplicated: Secondary | ICD-10-CM | POA: Insufficient documentation

## 2018-08-17 DIAGNOSIS — J069 Acute upper respiratory infection, unspecified: Secondary | ICD-10-CM | POA: Insufficient documentation

## 2018-08-17 MED ORDER — GUAIFENESIN 200 MG PO TABS: 400.0000 mg | ORAL_TABLET | Freq: Four times a day (QID) | ORAL | 0 refills | Status: DC | PRN

## 2018-08-17 MED ORDER — ALBUTEROL SULFATE HFA 108 (90 BASE) MCG/ACT IN AERS: 2.0000 | INHALATION_SPRAY | Freq: Four times a day (QID) | RESPIRATORY_TRACT | 0 refills | Status: DC | PRN

## 2018-08-17 MED ORDER — PSEUDOEPHEDRINE HCL 60 MG PO TABS: 60.0000 mg | ORAL_TABLET | ORAL | 0 refills | Status: DC | PRN

## 2018-08-17 NOTE — ED Provider Notes (Signed)
[facility] Emergency Department Provider Note ____________________________________________   First MD Initiated Contact with Patient 08/17/18 [phone]     (approximate)  I have reviewed the triage vital signs and the nursing notes.   HISTORY  Chief Complaint Cough    HPI Scott Carlson is a 27 y.o. male with PMH as noted below who presents with cough for the last several days, intermittent, productive of clear sputum, and associated with nasal congestion and sinus pressure.  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  He denies shortness of breath.  Past Medical History:  Diagnosis Date  . Bronchitis     Patient Active Problem List   Diagnosis Date Noted  . Mood disorder (HCC) 04/27/2013  . ANXIETY 02/21/2010    Past Surgical History:  Procedure Laterality Date  . MYRINGOTOMY WITH TUBE PLACEMENT      Prior to Admission medications   Medication Sig Start Date End Date Taking? Authorizing Provider  albuterol (PROVENTIL HFA;VENTOLIN HFA) 108 (90 Base) MCG/ACT inhaler Inhale 2 puffs into the lungs every 6 (six) hours as needed for up to 3 days for wheezing or shortness of breath. 08/17/18 08/20/18  Dionne Bucy, MD  guaiFENesin 200 MG tablet Take 2 tablets (400 mg total) by mouth every 6 (six) hours as needed for cough or to loosen phlegm. 08/17/18   Dionne Bucy, MD  meloxicam (MOBIC) 15 MG tablet Take 1 tablet (15 mg total) by mouth daily. 07/31/14   Ivonne Andrew, PA-C  naproxen (NAPROSYN) 500 MG tablet Take 1 tablet (500 mg total) by mouth 2 (two) times daily. 03/16/16   Sam, Ace Gins, PA-C  pseudoephedrine (SUDAFED) 60 MG tablet Take 1 tablet (60 mg total) by mouth every 4 (four) hours as needed for congestion. 08/17/18   Dionne Bucy, MD  triamcinolone cream (KENALOG) 0.1 % Apply 1 application topically 4 (four) times daily. 05/03/18   Cuthriell, Delorise Royals, PA-C    Allergies Patient has no known allergies.  Family History  Problem Relation Age of Onset  . Hypertension Other   . CAD Other   . Cancer Other   . Diabetes Other   . CAD Mother     Social History Social History   Tobacco Use  . Smoking status: Current Every Day Smoker    Packs/day: 0.25    Types: Cigarettes  . Smokeless tobacco: Never Used  Substance Use Topics  . Alcohol use: Yes    Comment: Occasionally  . Drug use: No    Types: Marijuana    Comment: stopped 5 months ago     Review of Systems  Constitutional: No fever. Eyes: No redness. ENT: No sore throat.  Positive for nasal congestion. Cardiovascular: Denies chest pain. Respiratory: Denies shortness of breath. Gastrointestinal: No vomiting.  Genitourinary: Negative for flank pain.  Musculoskeletal: Negative for back pain. Skin: Negative for rash. Neurological: Negative for headache.   ____________________________________________   PHYSICAL EXAM:  VITAL SIGNS: ED Triage Vitals  Enc Vitals Group     BP 08/17/18 0149 115/66     Pulse Rate 08/17/18 0149 64     Resp 08/17/18 0149 18     Temp --      Temp Source 08/17/18 0149 Oral     SpO2 08/17/18 0149 100 %     Weight 08/17/18 0150 175 lb (79.4 kg)     Height 08/17/18 0150 6\' 4"  (  1.93 m)     Head Circumference --      Peak Flow --      Pain Score 08/17/18 0149 0     Pain Loc --      Pain Edu? --      Excl. in GC? --     Constitutional: Alert and oriented. Well appearing and in no acute distress. Eyes: Conjunctivae are normal.  Head: Atraumatic. Nose: No congestion/rhinnorhea. Mouth/Throat: Mucous membranes are moist.  Oropharynx clear with no erythema or exudate. Neck: Normal range of motion.  Cardiovascular: Normal rate, regular rhythm. Grossly normal heart sounds.  Good peripheral circulation. Respiratory: Normal respiratory effort.  No retractions. Lungs CTAB. Gastrointestinal: No distention.  Musculoskeletal:  Extremities  warm and well perfused.  Neurologic:  Normal speech and language. No gross focal neurologic deficits are appreciated.  Skin:  Skin is warm and dry. No rash noted. Psychiatric: Mood and affect are normal. Speech and behavior are normal.  ____________________________________________   LABS (all labs ordered are listed, but only abnormal results are displayed)  Labs Reviewed - No data to display ____________________________________________  EKG   ____________________________________________  RADIOLOGY  CXR: No focal infiltrate or other acute abnormality  ____________________________________________   PROCEDURES  Procedure(s) performed: No  Procedures  Critical Care performed: No ____________________________________________   INITIAL IMPRESSION / ASSESSMENT AND PLAN / ED COURSE  Pertinent labs & imaging results that were available during my care of the patient were reviewed by me and considered in my medical decision making (see chart for details).  27 year old male with PMH as noted above presents with cough with sinus pressure and URI symptoms over the last several days.  On exam, the patient is well-appearing, vital signs are normal, and the exam is unremarkable.  Chest x-ray shows no focal infiltrate.  The presentation is consistent with viral URI/bronchitis, possibly exacerbated by dust patient is exposed to work.  Given his normal vital signs and reassuring x-ray he is stable for discharge at this time.  I will treat symptomatically with Sudafed, guaifenesin, and albuterol.  Return precautions given, and he expresses understanding.   ____________________________________________   FINAL CLINICAL IMPRESSION(S) / ED DIAGNOSES  Final diagnoses:  Viral URI with cough      NEW MEDICATIONS STARTED DURING THIS VISIT:  New Prescriptions   ALBUTEROL (PROVENTIL HFA;VENTOLIN HFA) 108 (90 BASE) MCG/ACT INHALER    Inhale 2 puffs into the lungs every 6 (six) hours as  needed for up to 3 days for wheezing or shortness of breath.   GUAIFENESIN 200 MG TABLET    Take 2 tablets (400 mg total) by mouth every 6 (six) hours as needed for cough or to loosen phlegm.   PSEUDOEPHEDRINE (SUDAFED) 60 MG TABLET    Take 1 tablet (60 mg total) by mouth every 4 (four) hours as needed for congestion.     Note:  This document was prepared using Dragon voice recognition software and may include unintentional dictation errors.    Dionne Bucy, MD 08/17/18 [phone]

## 2018-08-17 NOTE — ED Notes (Signed)
ED Provider at bedside. 

## 2018-08-17 NOTE — ED Triage Notes (Signed)
Patient ambulatory to triage with steady gait, without difficulty or distress noted; pt reports prod cough white sputum since Thursday with sinus congestion; denies fever

## 2018-09-02 ENCOUNTER — Other Ambulatory Visit: Payer: Self-pay

## 2018-09-02 ENCOUNTER — Encounter (HOSPITAL_COMMUNITY): Payer: Self-pay | Admitting: *Deleted

## 2018-09-02 ENCOUNTER — Emergency Department (HOSPITAL_COMMUNITY): Payer: Self-pay

## 2018-09-02 ENCOUNTER — Emergency Department (HOSPITAL_COMMUNITY)
Admission: EM | Admit: 2018-09-02 | Discharge: 2018-09-03 | Disposition: A | Discharge status: Home / Self Care | Payer: Self-pay | Attending: Emergency Medicine | Admitting: Emergency Medicine

## 2018-09-02 DIAGNOSIS — Y929 Unspecified place or not applicable: Secondary | ICD-10-CM | POA: Insufficient documentation

## 2018-09-02 DIAGNOSIS — F17210 Nicotine dependence, cigarettes, uncomplicated: Secondary | ICD-10-CM | POA: Insufficient documentation

## 2018-09-02 DIAGNOSIS — S91332A Puncture wound without foreign body, left foot, initial encounter: Secondary | ICD-10-CM | POA: Insufficient documentation

## 2018-09-02 DIAGNOSIS — Y998 Other external cause status: Secondary | ICD-10-CM | POA: Insufficient documentation

## 2018-09-02 DIAGNOSIS — Y9389 Activity, other specified: Secondary | ICD-10-CM | POA: Insufficient documentation

## 2018-09-02 DIAGNOSIS — Z23 Encounter for immunization: Secondary | ICD-10-CM | POA: Insufficient documentation

## 2018-09-02 DIAGNOSIS — W458XXA Other foreign body or object entering through skin, initial encounter: Secondary | ICD-10-CM | POA: Insufficient documentation

## 2018-09-02 DIAGNOSIS — Z79899 Other long term (current) drug therapy: Secondary | ICD-10-CM | POA: Insufficient documentation

## 2018-09-02 MED ORDER — CIPROFLOXACIN HCL 500 MG PO TABS
500.0000 mg | ORAL_TABLET | Freq: Once | ORAL | Status: AC
  Administered 2018-09-02: 500 mg via ORAL
  Filled 2018-09-02: qty 1

## 2018-09-02 MED ORDER — TETANUS-DIPHTH-ACELL PERTUSSIS 5-2.5-18.5 LF-MCG/0.5 IM SUSP
0.5000 mL | Freq: Once | INTRAMUSCULAR | Status: AC
  Administered 2018-09-02: 0.5 mL via INTRAMUSCULAR
  Filled 2018-09-02: qty 0.5

## 2018-09-02 MED ORDER — CEPHALEXIN 500 MG PO CAPS
500.0000 mg | ORAL_CAPSULE | Freq: Once | ORAL | Status: AC
  Administered 2018-09-02: 500 mg via ORAL
  Filled 2018-09-02: qty 1

## 2018-09-02 NOTE — ED Triage Notes (Signed)
Patient arrives ambulatory to triage with complaints that he stepped on a metal wire yesterday with his left foot. Small amount of redness around wound area. Last tetanus unknown.

## 2018-09-02 NOTE — ED Notes (Signed)
Patient transported to X-ray 

## 2018-09-02 NOTE — ED Notes (Signed)
Bed: WTR7 Expected date:  Expected time:  Means of arrival:  Comments: 

## 2018-09-02 NOTE — ED Provider Notes (Signed)
[facility]-EMERGENCY DEPT Provider Note   CSN: [id number] Arrival date & time: 09/02/18  2113     History   Chief Complaint Chief Complaint  Patient presents with  . Foot Pain    HPI Scott Carlson is a 27 y.o. male.  HPI  Patient is a 27 year old male with a history of bronchitis and mood disorder presenting for puncture wound to the left foot.  Patient reports that this occurred yesterday when he was working in concrete.  Patient reports that a metal wire extruded up through his work boot and into his foot.  Patient reports that he initially did not have pain, however he has had progressive pain with ambulating on the foot and some small amount of erythema around the puncture wound today.  Patient denies any drainage, decreased range of motion of the foot, fever or chills.  Patient does not know when his last tetanus shot was.  Past Medical History:  Diagnosis Date  . Bronchitis     Patient Active Problem List   Diagnosis Date Noted  . Mood disorder (HCC) 04/27/2013  . ANXIETY 02/21/2010    Past Surgical History:  Procedure Laterality Date  . MYRINGOTOMY WITH TUBE PLACEMENT          Home Medications    Prior to Admission medications   Medication Sig Start Date End Date Taking? Authorizing Provider  albuterol (PROVENTIL HFA;VENTOLIN HFA) 108 (90 Base) MCG/ACT inhaler Inhale 2 puffs into the lungs every 6 (six) hours as needed for up to 3 days for wheezing or shortness of breath. 08/17/18 08/20/18  Dionne Bucy, MD  guaiFENesin 200 MG tablet Take 2 tablets (400 mg total) by mouth every 6 (six) hours as needed for cough or to loosen phlegm. 08/17/18   Dionne Bucy, MD  meloxicam (MOBIC) 15 MG tablet Take 1 tablet (15 mg total) by mouth daily. 07/31/14   Ivonne Andrew, PA-C  naproxen (NAPROSYN) 500 MG tablet Take 1 tablet (500 mg total) by mouth 2 (two) times daily. 03/16/16   Sam, Ace Gins, PA-C  pseudoephedrine (SUDAFED) 60 MG tablet  Take 1 tablet (60 mg total) by mouth every 4 (four) hours as needed for congestion. 08/17/18   Dionne Bucy, MD  triamcinolone cream (KENALOG) 0.1 % Apply 1 application topically 4 (four) times daily. 05/03/18   Cuthriell, Delorise Royals, PA-C    Family History Family History  Problem Relation Age of Onset  . Hypertension Other   . CAD Other   . Cancer Other   . Diabetes Other   . CAD Mother     Social History Social History   Tobacco Use  . Smoking status: Current Every Day Smoker    Packs/day: 0.25    Types: Cigarettes  . Smokeless tobacco: Never Used  Substance Use Topics  . Alcohol use: Yes    Comment: Occasionally  . Drug use: No    Types: Marijuana    Comment: stopped 5 months ago      Allergies   Patient has no known allergies.   Review of Systems Review of Systems  Constitutional: Negative for chills and fever.  Musculoskeletal: Positive for arthralgias and myalgias. Negative for joint swelling.  Skin: Positive for wound.  Neurological: Negative for weakness and numbness.     Physical Exam Updated Vital Signs BP 117/70   Pulse 69   Temp 98.9 F (37.2 C) (Oral)   Resp 15   Ht 6\' 3"  (1.905 m)   Wt 79.4 kg  SpO2 99%   BMI 21.87 kg/m   Physical Exam  Constitutional: He appears well-developed and well-nourished. No distress.  Sitting comfortably in bed.  HENT:  Head: Normocephalic and atraumatic.  Eyes: Conjunctivae are normal. Right eye exhibits no discharge. Left eye exhibits no discharge.  EOMs normal to gross examination.  Neck: Normal range of motion.  Cardiovascular: Normal rate and regular rhythm.  Intact, 2+DP pulse of LLE.  Pulmonary/Chest:  Normal respiratory effort. Patient converses comfortably. No audible wheeze or stridor.  Abdominal: He exhibits no distension.  Musculoskeletal: Normal range of motion.  Patient exhibits puncture wound on the plantar medial aspect of the left foot.  There is small amount of erythema, but it is  well-healed and not draining.  Patient has tenderness palpation surrounding the site.  Neurological: He is alert.  Cranial nerves intact to gross observation. Patient moves extremities without difficulty.  Skin: Skin is warm and dry. He is not diaphoretic.  Psychiatric: He has a normal mood and affect. His behavior is normal. Judgment and thought content normal.  Nursing note and vitals reviewed.    ED Treatments / Results  Labs (all labs ordered are listed, but only abnormal results are displayed) Labs Reviewed - No data to display  EKG None  Radiology Dg Foot Complete Left  Result Date: 09/02/2018 CLINICAL DATA:  27 year old male with left foot injury. EXAM: LEFT FOOT - COMPLETE 3+ VIEW COMPARISON:  None. FINDINGS: There is no evidence of fracture or dislocation. There is no evidence of arthropathy or other focal bone abnormality. Soft tissues are unremarkable. IMPRESSION: Negative. Electronically Signed   By: Elgie Collard M.D.   On: 09/02/2018 22:50    Procedures Procedures (including critical care time)  Medications Ordered in ED Medications  Tdap (BOOSTRIX) injection 0.5 mL (0.5 mLs Intramuscular Given 09/02/18 2243)  ciprofloxacin (CIPRO) tablet 500 mg (500 mg Oral Given 09/02/18 2316)  cephALEXin (KEFLEX) capsule 500 mg (500 mg Oral Given 09/02/18 2316)     Initial Impression / Assessment and Plan / ED Course  I have reviewed the triage vital signs and the nursing notes.  Pertinent labs & imaging results that were available during my care of the patient were reviewed by me and considered in my medical decision making (see chart for details).     Patient nontoxic-appearing, afebrile, and in no acute distress.  Patient without signs of active cellulitis, but has a high risk puncture wound that requires antibacterial prophylaxis, particular with Pseudomonas coverage.  Will cover with ciprofloxacin and Keflex.  I discussed return precautions with patient including  increasing erythema, drainage, or pain.  I also discussed proper wound care with the patient.  Patient is in understanding and agrees with the plan of care.  Final Clinical Impressions(s) / ED Diagnoses   Final diagnoses:  Puncture wound of left foot, initial encounter    ED Discharge Orders         Ordered    ciprofloxacin (CIPRO) 500 MG tablet  Every 12 hours     09/03/18 0005    cephALEXin (KEFLEX) 500 MG capsule  4 times daily     09/03/18 0005           Elisha Ponder, PA-C 09/03/18 0009    Tegeler, Canary Brim, MD 09/03/18 [phone]

## 2018-09-03 MED ORDER — CEPHALEXIN 500 MG PO CAPS: 500.0000 mg | ORAL_CAPSULE | Freq: Four times a day (QID) | ORAL | 0 refills | Status: DC

## 2018-09-03 MED ORDER — CIPROFLOXACIN HCL 500 MG PO TABS: 500.0000 mg | ORAL_TABLET | Freq: Two times a day (BID) | ORAL | 0 refills | Status: DC

## 2018-09-03 NOTE — Discharge Instructions (Signed)
Please see the information and instructions below regarding your visit.  Your diagnoses today include:  1. Puncture wound of left foot, initial encounter    Your work-up is reassuring today.  There is no evidence of foreign body on your x-ray.  Tests performed today include: See side panel of your discharge paperwork for testing performed today. Vital signs are listed at the bottom of these instructions.   Medications prescribed:    Take any prescribed medications only as prescribed, and any over the counter medications only as directed on the packaging.  You will take 2 antibiotics.  Please take all of your antibiotics until finished.   You may develop abdominal discomfort or nausea from the antibiotic. If this occurs, you may take it with food. Some patients also get diarrhea with antibiotics. You may help offset this with probiotics which you can buy or get in yogurt. Do not eat or take the probiotics until 2 hours after your antibiotic. Some women develop vaginal yeast infections after antibiotics. If you develop unusual vaginal discharge after being on this medication, please see your primary care provider.   Some people develop allergies to antibiotics. Symptoms of antibiotic allergy can be mild and include a flat rash and itching. They can also be more serious and include:  ?Hives - Hives are raised, red patches of skin that are usually very itchy.  ?Lip or tongue swelling  ?Trouble swallowing or breathing  ?Blistering of the skin or mouth.  If you have any of these serious symptoms, please seek emergency medical care immediately.   Home care instructions:  Please follow any educational materials contained in this packet.   Please mix 50% hydroperoxide with 50% warm water.  It is very important it perform the soaks at least once a day particularly when you get home from work.  This will help keep the wound clean.  Please keep the wound bandaged at all times.  Follow-up  instructions: Please follow-up with your primary care provider in 5 days for further evaluation of your symptoms if they are not completely improved.   Return instructions:  Please return to the Emergency Department if you experience worsening symptoms.  Please return the emergency department if you develop any increasing pain, swelling, redness or drainage from the wound. Please return if you have any other emergent concerns.  Additional Information:   Your vital signs today were: BP 117/70    Pulse 69    Temp 98.9 F (37.2 C) (Oral)    Resp 15    Ht 6\' 3"  (1.905 m)    Wt 79.4 kg    SpO2 99%    BMI 21.87 kg/m  If your blood pressure (BP) was elevated on multiple readings during this visit above 130 for the top number or above 80 for the bottom number, please have this repeated by your primary care provider within one month. --------------  Thank you for allowing [place] to participate in your care today.

## 2019-01-13 IMAGING — CR DG CHEST 2V
2 series · 2 of 2 positions shown · non-contrast
Comparison: 09/12/2011

CLINICAL DATA: Productive cough.  Congestion.

EXAM:
CHEST - 2 VIEW

[chest pa]
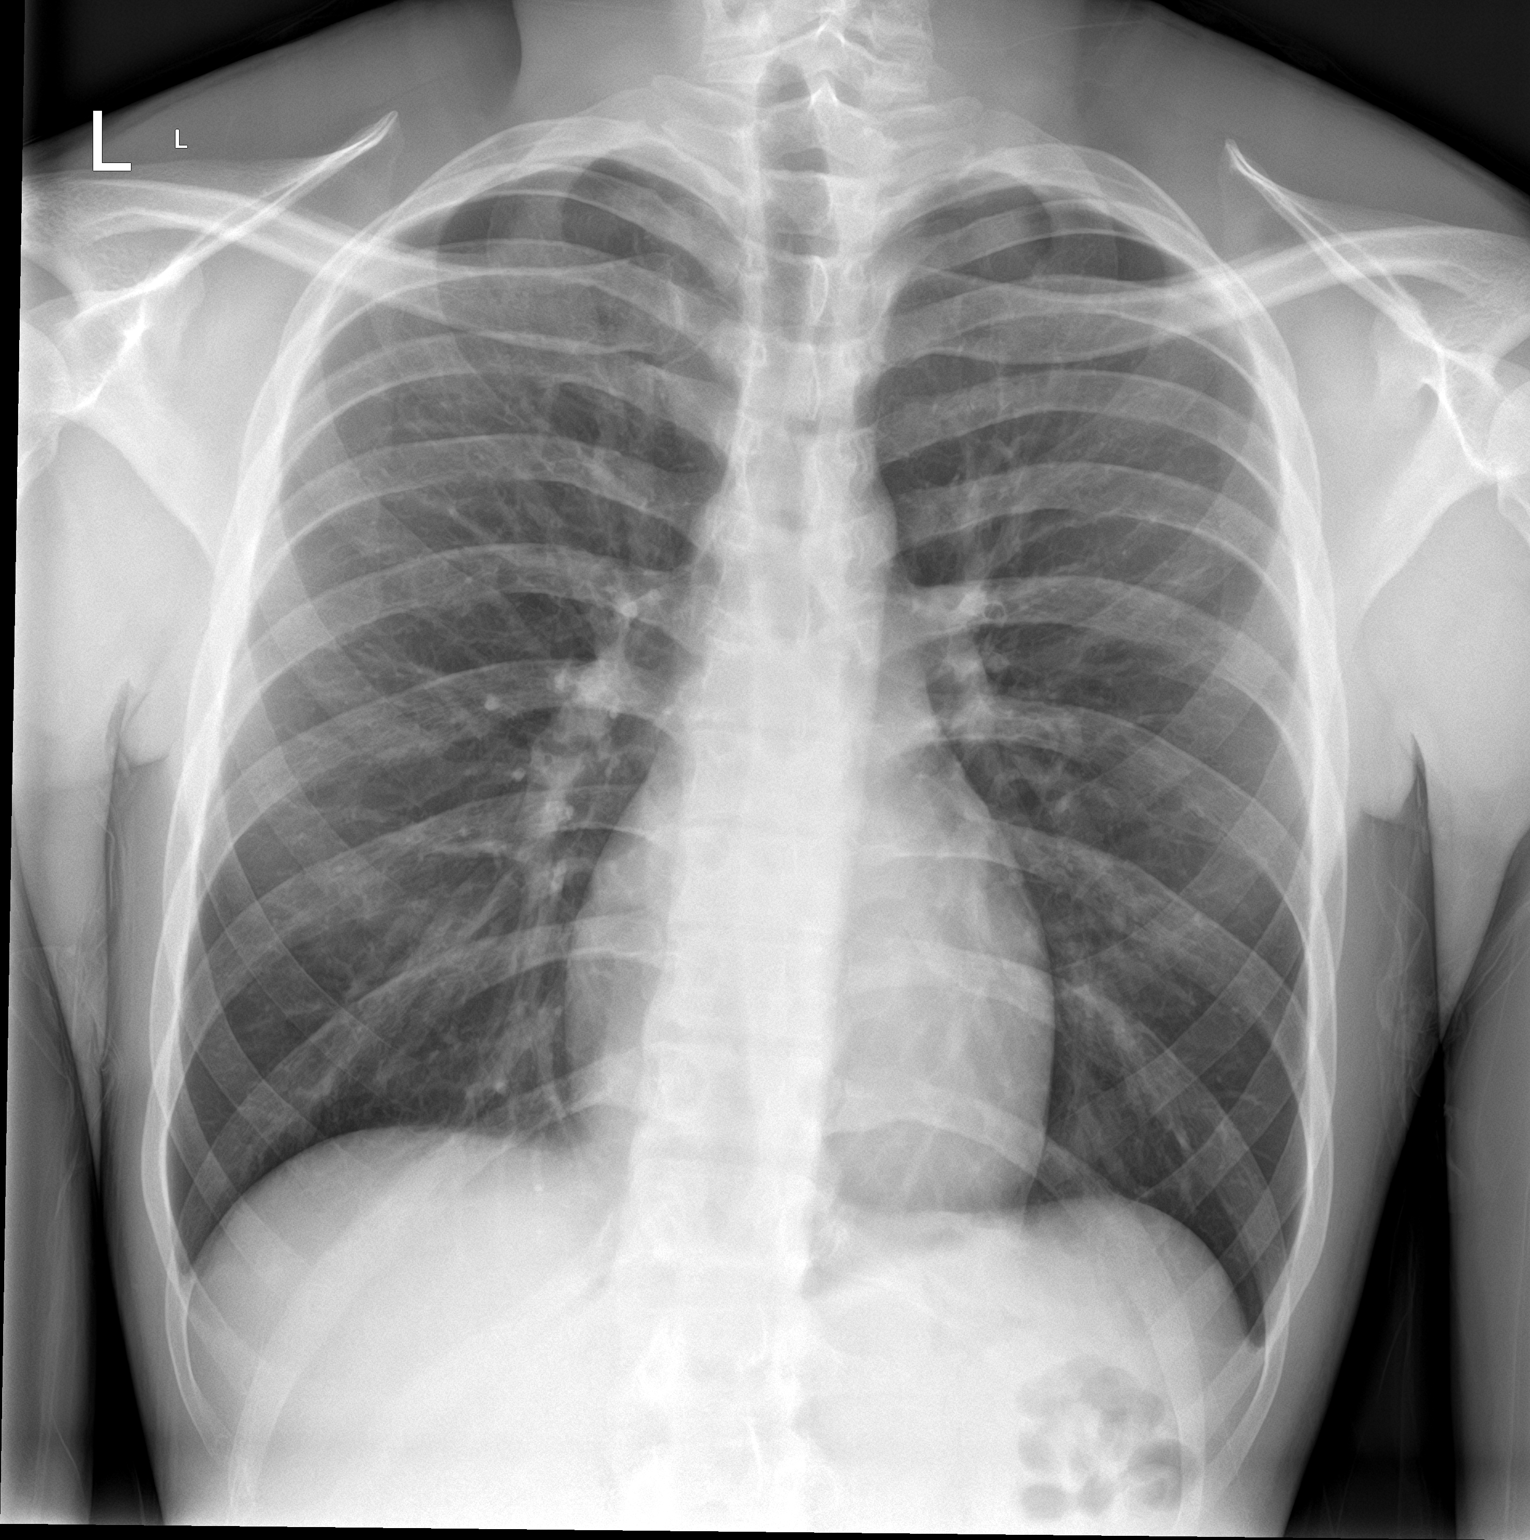

[chest lat]
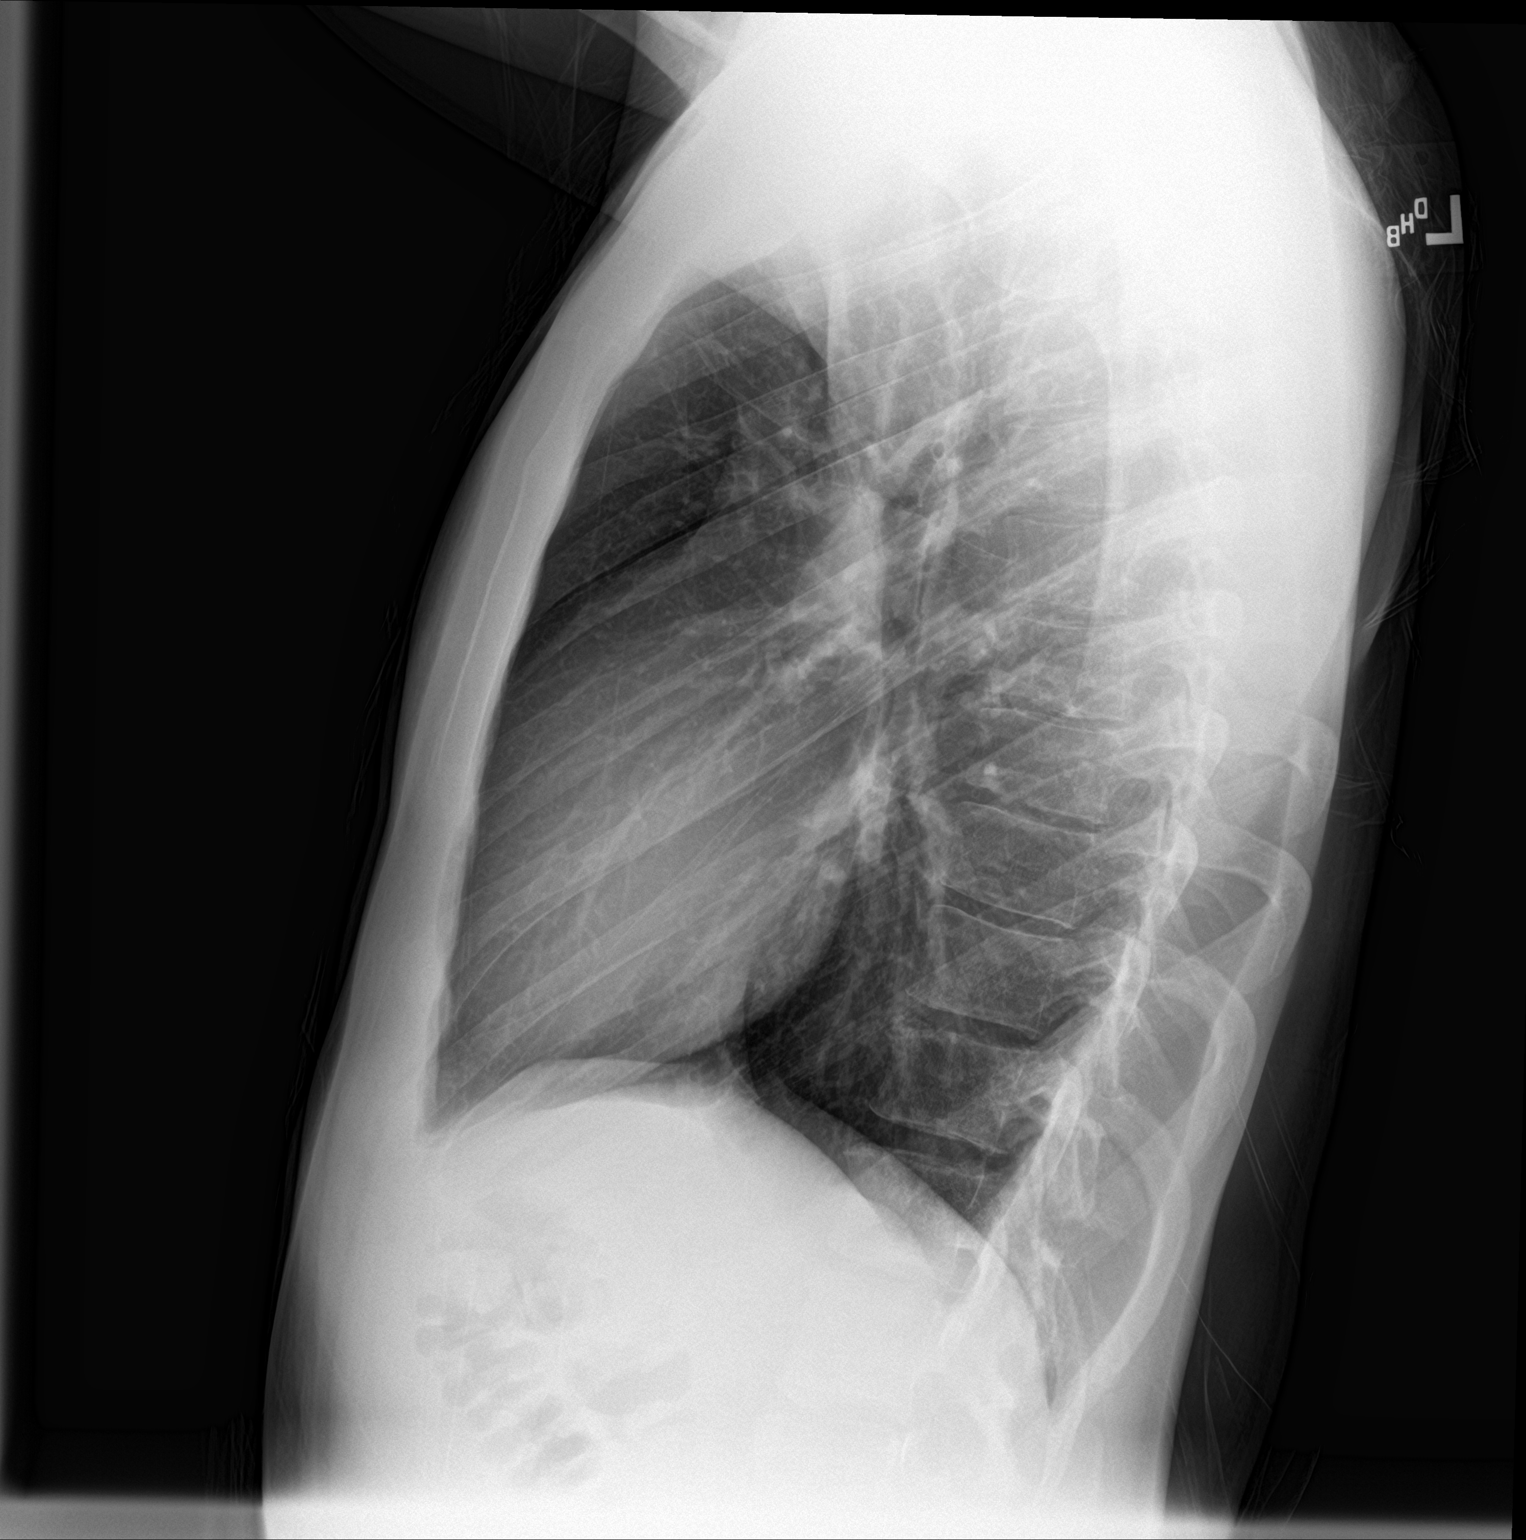

[2 of 2 positions shown; findings below may reference images not displayed]

FINDINGS: The cardiomediastinal contours are normal. The lungs are clear.
Pulmonary vasculature is normal. No consolidation, pleural effusion,
or pneumothorax. No acute osseous abnormalities are seen.
IMPRESSION: Negative radiographs of the chest.

## 2019-01-29 IMAGING — CR DG FOOT COMPLETE 3+V*L*
3 series · 3 of 3 positions shown · non-contrast
Comparison: None.

CLINICAL DATA: 26-year-old male with left foot injury.

EXAM:
LEFT FOOT - COMPLETE 3+ VIEW

[x foot ap left]
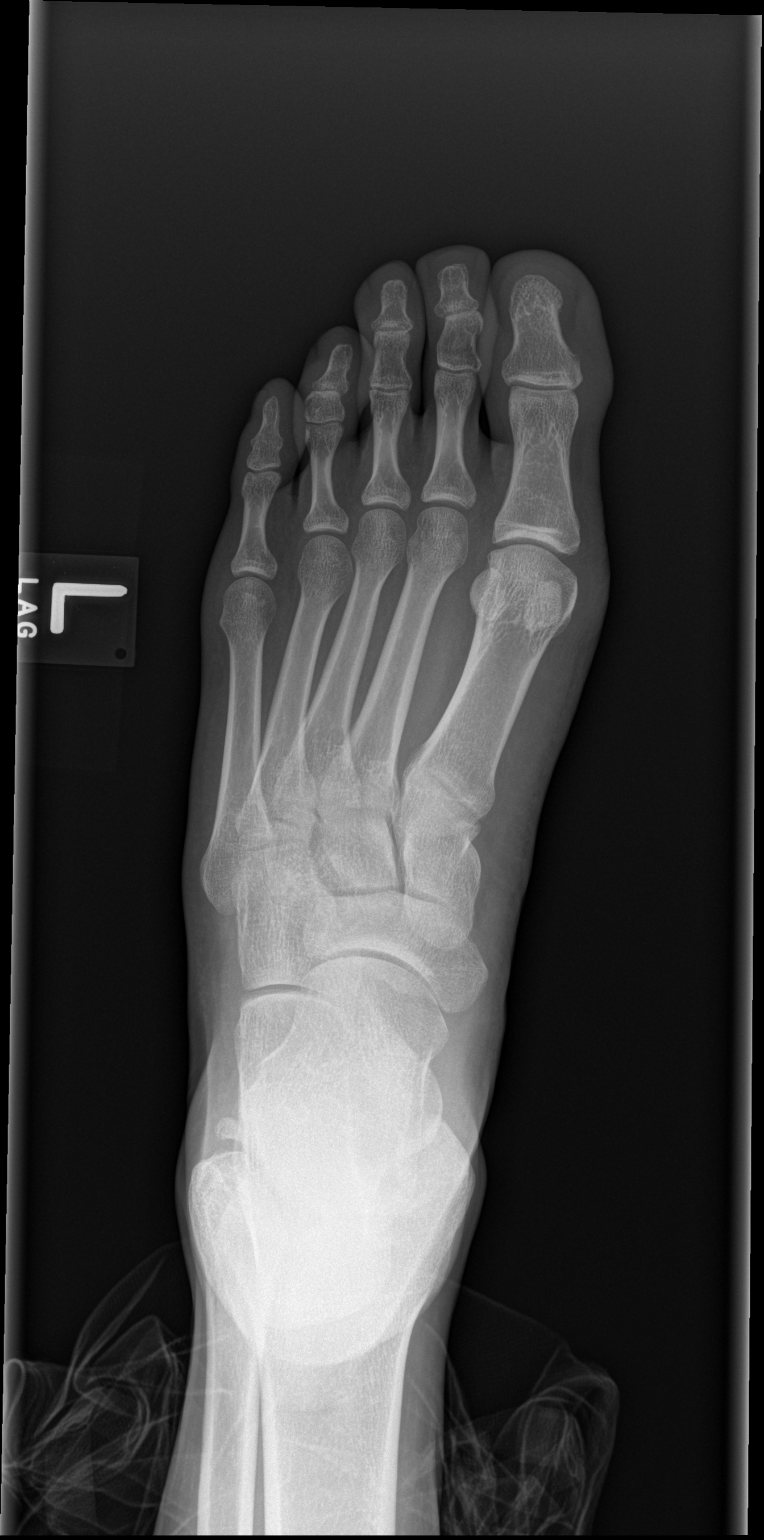

[x foot obl left]
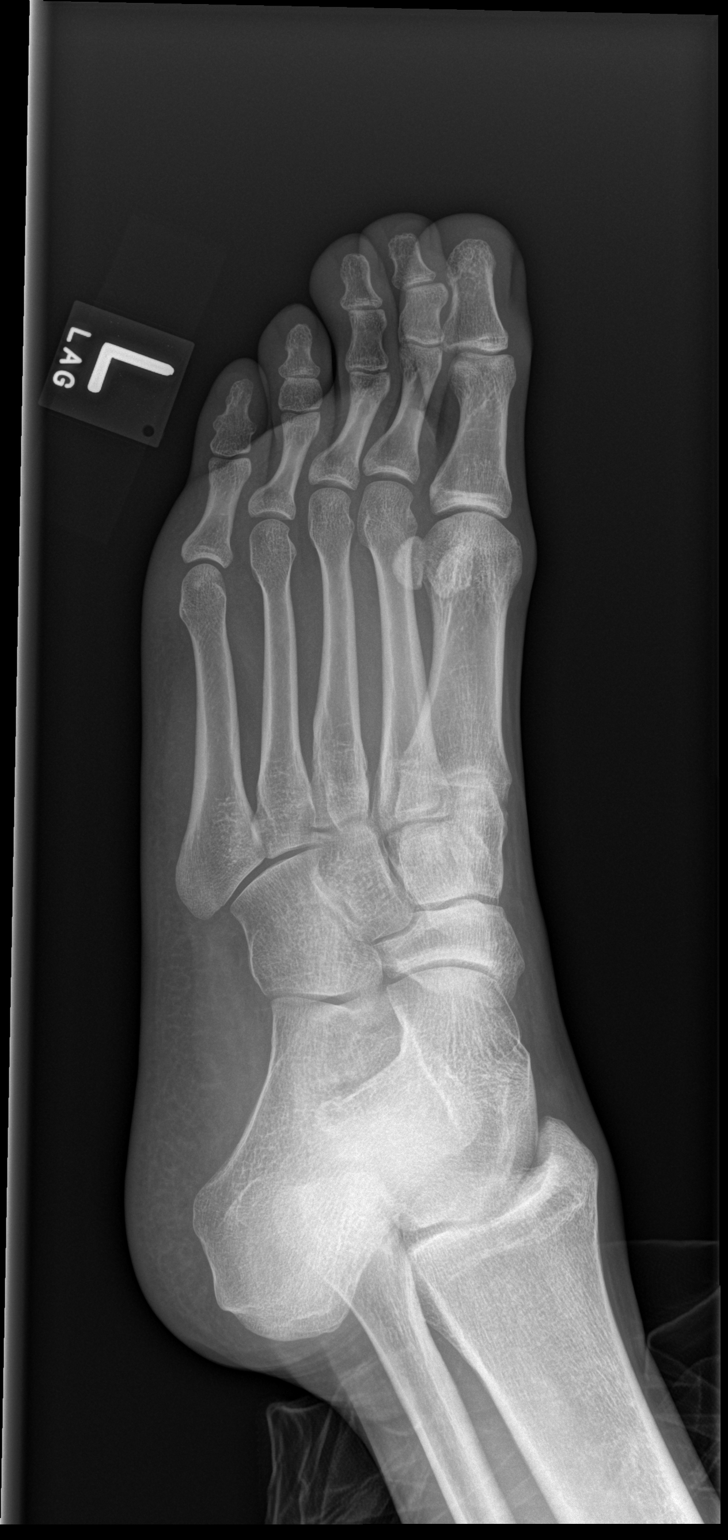

[x foot lat left]
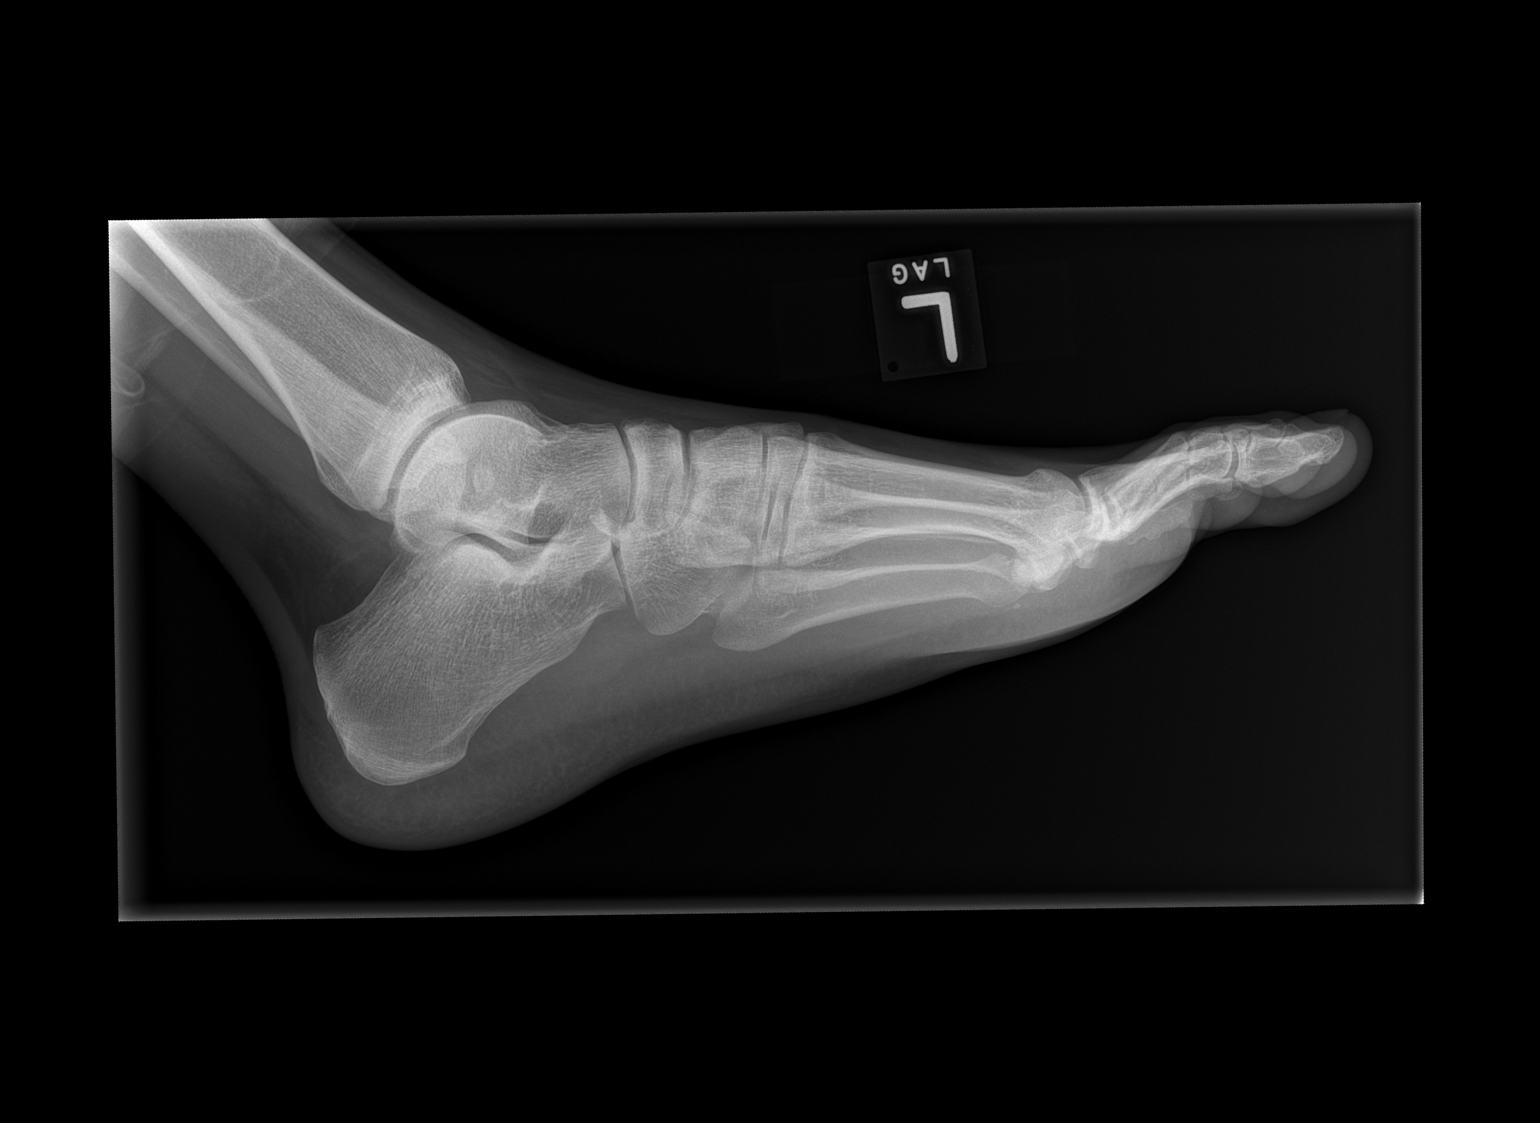

[3 of 3 positions shown; findings below may reference images not displayed]

FINDINGS: There is no evidence of fracture or dislocation. There is no
evidence of arthropathy or other focal bone abnormality. Soft
tissues are unremarkable.
IMPRESSION: Negative.

## 2019-07-18 ENCOUNTER — Other Ambulatory Visit: Payer: Self-pay

## 2019-07-18 ENCOUNTER — Ambulatory Visit (LOCAL_COMMUNITY_HEALTH_CENTER): Payer: Self-pay

## 2019-07-18 DIAGNOSIS — Z113 Encounter for screening for infections with a predominantly sexual mode of transmission: Secondary | ICD-10-CM

## 2019-07-18 LAB — GRAM STAIN

## 2019-07-18 NOTE — Progress Notes (Signed)
    STI clinic/screening visit  Subjective:  Scott Carlson is a 28 y.o. male being seen today for an STI screening visit. The patient reports they do not have symptoms.  Patient has the following medical conditions:   Patient Active Problem List   Diagnosis Date Noted  . Mood disorder ([place]) 04/27/2013  . ANXIETY 02/21/2010     Chief Complaint  Patient presents with  . SEXUALLY TRANSMITTED DISEASE    HPI  Patient reports no sympts here for STD screen only.  See flowsheet for further details and programmatic requirements.    The following portions of the patient's history were reviewed and updated as appropriate: allergies, current medications, past medical history, past social history, past surgical history and problem list.  Objective:  There were no vitals filed for this visit.  Physical Exam HENT:     Mouth/Throat:     Mouth: Mucous membranes are moist.     Pharynx: Oropharynx is clear. No oropharyngeal exudate or posterior oropharyngeal erythema.  Neck:     Musculoskeletal: No muscular tenderness.  Abdominal:     Palpations: Abdomen is soft.     Tenderness: There is no abdominal tenderness.  Genitourinary:    Penis: Normal.      Scrotum/Testes: Normal.  Skin:    General: Skin is warm and dry.     Findings: No lesion or rash.  Neurological:     Mental Status: He is alert.       Assessment and Plan:  Scott Carlson is a 28 y.o. male presenting to the [facility] Department for STI screening  1. Screening examination for venereal disease  - Gram stain - Gonococcus culture - Gonococcus culture Treat gram stain as per SO. Condoms always for STD prevention    No follow-ups on file.  No future appointments.  Hassell Done, FNP

## 2019-07-18 NOTE — Progress Notes (Signed)
Here today for STD screening. Declines bloodwork. Hal Morales, RN Gram Stain results reviewed. Per standing order no treatment indicated. Hal Morales, RN

## 2019-07-23 LAB — GONOCOCCUS CULTURE

## 2020-05-30 ENCOUNTER — Ambulatory Visit: Payer: Self-pay | Attending: Internal Medicine

## 2020-12-20 ENCOUNTER — Ambulatory Visit
Admission: EM | Admit: 2020-12-20 | Discharge: 2020-12-20 | Disposition: A | Discharge status: Home / Self Care | Payer: BLUE CROSS/BLUE SHIELD | Attending: Family Medicine | Admitting: Family Medicine

## 2020-12-20 DIAGNOSIS — J039 Acute tonsillitis, unspecified: Secondary | ICD-10-CM

## 2020-12-20 LAB — POCT RAPID STREP A (OFFICE): Rapid Strep A Screen: NEGATIVE

## 2020-12-20 MED ORDER — AMOXICILLIN 500 MG PO CAPS: 1000.0000 mg | ORAL_CAPSULE | Freq: Every day | ORAL | 0 refills | Status: AC

## 2020-12-20 MED ORDER — DEXAMETHASONE SODIUM PHOSPHATE 10 MG/ML IJ SOLN
10.0000 mg | Freq: Once | INTRAMUSCULAR | Status: AC
  Administered 2020-12-20: 10 mg

## 2020-12-20 NOTE — Discharge Instructions (Addendum)
Take the amoxicillin as prescribed Decadron steroid given here  You can take tylenol or ibuprofen for pain Follow up as needed for continued or worsening symptoms

## 2020-12-20 NOTE — ED Provider Notes (Signed)
Scott Carlson    CSN: [id number] Arrival date & time: 12/20/20  0901      History   Chief Complaint Chief Complaint  Patient presents with  . Sore Throat    HPI Scott Carlson is a 30 y.o. male.   30 year old male who presents today with sore throat that began today.  Symptoms have been constant.  Pain with swallowing and mild difficulty swallowing.  Fever, chills.  No cough, chest congestion.  Had COVID in December.     Past Medical History:  Diagnosis Date  . Bronchitis     Patient Active Problem List   Diagnosis Date Noted  . Mood disorder (HCC) 04/27/2013  . ANXIETY 02/21/2010    Past Surgical History:  Procedure Laterality Date  . MYRINGOTOMY WITH TUBE PLACEMENT         Home Medications    Prior to Admission medications   Medication Sig Start Date End Date Taking? Authorizing Provider  amoxicillin (AMOXIL) 500 MG capsule Take 2 capsules (1,000 mg total) by mouth daily for 10 days. 12/20/20 12/30/20 Yes Janace Aris, NP    Family History Family History  Problem Relation Age of Onset  . Hypertension Other   . CAD Other   . Cancer Other   . Diabetes Other   . CAD Mother     Social History Social History   Tobacco Use  . Smoking status: Former Smoker    Types: Cigarettes  . Smokeless tobacco: Never Used  Substance Use Topics  . Alcohol use: Yes    Comment: Occasionally  . Drug use: Yes    Types: Marijuana    Comment: daily     Allergies   Patient has no known allergies.   Review of Systems Review of Systems   Physical Exam Triage Vital Signs ED Triage Vitals  Enc Vitals Group     BP 12/20/20 0917 131/78     Pulse Rate 12/20/20 0917 87     Resp 12/20/20 0917 17     Temp 12/20/20 0917 99.2 F (37.3 C)     Temp Source 12/20/20 0917 Oral     SpO2 12/20/20 0917 97 %     Weight --      Height --      Head Circumference --      Peak Flow --      Pain Score 12/20/20 0931 3     Pain Loc --      Pain Edu? --       Excl. in GC? --    No data found.  Updated Vital Signs BP 131/78 (BP Location: Left Arm)   Pulse 87   Temp 99.2 F (37.3 C) (Oral)   Resp 17   SpO2 97%   Visual Acuity Right Eye Distance:   Left Eye Distance:   Bilateral Distance:    Right Eye Near:   Left Eye Near:    Bilateral Near:     Physical Exam Vitals and nursing note reviewed.  Constitutional:      Appearance: Normal appearance.  HENT:     Head: Normocephalic and atraumatic.     Nose: Nose normal.     Mouth/Throat:     Pharynx: Uvula midline. Pharyngeal swelling and posterior oropharyngeal erythema present.     Tonsils: No tonsillar exudate. 1+ on the right. 1+ on the left.     Comments: Muffled voice  Eyes:     Conjunctiva/sclera: Conjunctivae normal.  Pulmonary:  Effort: Pulmonary effort is normal.  Abdominal:     Palpations: Abdomen is soft.     Tenderness: There is no abdominal tenderness.  Musculoskeletal:        General: Normal range of motion.     Cervical back: Normal range of motion.  Skin:    General: Skin is warm and dry.  Neurological:     Mental Status: He is alert.  Psychiatric:        Mood and Affect: Mood normal.      UC Treatments / Results  Labs (all labs ordered are listed, but only abnormal results are displayed) Labs Reviewed  CULTURE, GROUP A STREP [facility])  POCT RAPID STREP A (OFFICE)    EKG   Radiology No results found.  Procedures Procedures (including critical care time)  Medications Ordered in UC Medications  dexamethasone (DECADRON) injection 10 mg (10 mg Other Given 12/20/20 0951)    Initial Impression / Assessment and Plan / UC Course  I have reviewed the triage vital signs and the nursing notes.  Pertinent labs & imaging results that were available during my care of the patient were reviewed by me and considered in my medical decision making (see chart for details).     Tonsillitis or questionable early developing tonsillar abscess We will go  ahead and treat with Decadron here today for swelling and inflammation in the tonsillar area.  Sending antibiotics to the pharmacy to treat for infection Culture pending.  Recommend ibuprofen as needed Follow up as needed for continued or worsening symptoms  Final Clinical Impressions(s) / UC Diagnoses   Final diagnoses:  Tonsillitis     Discharge Instructions     Take the amoxicillin as prescribed Decadron steroid given here  You can take tylenol or ibuprofen for pain Follow up as needed for continued or worsening symptoms     ED Prescriptions    Medication Sig Dispense Auth. Provider   amoxicillin (AMOXIL) 500 MG capsule Take 2 capsules (1,000 mg total) by mouth daily for 10 days. 20 capsule Dahlia Byes A, NP     PDMP not reviewed this encounter.   Dahlia Byes A, NP 12/20/20 1017

## 2020-12-20 NOTE — ED Triage Notes (Signed)
Pt presents with sore throat that began today. Pt state he had covid in December

## 2020-12-22 LAB — CULTURE, GROUP A STREP (THRC)

## 2020-12-23 LAB — CULTURE, GROUP A STREP (THRC)

## 2021-06-17 ENCOUNTER — Ambulatory Visit: Payer: Self-pay | Admitting: Physician Assistant

## 2021-06-17 ENCOUNTER — Other Ambulatory Visit: Payer: Self-pay

## 2021-06-17 ENCOUNTER — Ambulatory Visit: Payer: Self-pay

## 2021-06-17 DIAGNOSIS — Z113 Encounter for screening for infections with a predominantly sexual mode of transmission: Secondary | ICD-10-CM

## 2021-06-17 NOTE — Progress Notes (Signed)
S:  Patient into clinic requesting HSV testing only.  Reports that a partner informed him that she tested positive for HSV and he wants to be tested.  Patient denies any current or previous symptoms.   NKDA. O:  WDWN male in NAD, A&O x 3, normal work of breathing. A/P:  1.  Patient desires testing for HSV. 2.  Counseled patient re: [facility] services and that we routinely test for HIV, Syphilis, GC and NGU.   3.  Counseled patient that if he does not have any lesions, then I cannot do a HSV test/ culture. 4.  Counseled patient about HSV- dz, transmission, cyclic nature, symptoms and subclinical disease. 5. Counseled patient that he can have HSV serology with his PCP, an urgent care, or through virtual visit with a provider online or can RTC at [facility] if he has any lesions appear. 6.  Enc condoms with all sex for STD protection.

## 2023-11-24 ENCOUNTER — Encounter (HOSPITAL_COMMUNITY): Payer: Self-pay

## 2023-11-24 ENCOUNTER — Emergency Department (HOSPITAL_COMMUNITY): Payer: 59

## 2023-11-24 ENCOUNTER — Emergency Department (HOSPITAL_COMMUNITY)
Admission: EM | Admit: 2023-11-24 | Discharge: 2023-11-24 | Disposition: A | Discharge status: Home / Self Care | Payer: 59 | Attending: Emergency Medicine | Admitting: Emergency Medicine

## 2023-11-24 ENCOUNTER — Other Ambulatory Visit: Payer: Self-pay

## 2023-11-24 DIAGNOSIS — R059 Cough, unspecified: Secondary | ICD-10-CM | POA: Diagnosis present

## 2023-11-24 DIAGNOSIS — Z1152 Encounter for screening for COVID-19: Secondary | ICD-10-CM | POA: Diagnosis not present

## 2023-11-24 DIAGNOSIS — J069 Acute upper respiratory infection, unspecified: Secondary | ICD-10-CM | POA: Diagnosis not present

## 2023-11-24 LAB — RESP PANEL BY RT-PCR (RSV, FLU A&B, COVID)  RVPGX2
Influenza A by PCR: NEGATIVE
Influenza B by PCR: NEGATIVE
Resp Syncytial Virus by PCR: NEGATIVE
SARS Coronavirus 2 by RT PCR: NEGATIVE

## 2023-11-24 NOTE — Discharge Instructions (Signed)
Thank you for letting us evaluate you today.  You are negative for COVID, RSV, flu.  Your chest x-ray does not show any pneumonia or fluid in the lungs.  This is likely a viral infection as it has been less than 10 days.  Please continue to use Tylenol and Motrin at home as needed for body aches or fever.  Make sure to remain hydrated.  I provided you with a contact me health and wellness resource for you to follow-up with if necessary and routine medical complaints and to establish PCP care.  Return to emergency department if you experience intractable vomiting affecting your oral hydration, chest pain, shortness of breath, wheezing

## 2023-11-24 NOTE — ED Provider Notes (Addendum)
[place] EMERGENCY DEPARTMENT AT [facility] Provider Note   CSN: [id number] Arrival date & time: 11/24/23  4098     History  Chief Complaint  Patient presents with   Nasal Congestion   Cough    EMRYS STAROSTA is a 32 y.o. male with no significant past medical history presents emergency department for evaluation of nasal congestion, productive cough, 1 episode of chills over the past week.  He states that he has heard a lot about pneumonia recently and is concerned that he has a "walking pneumonia".  He denies known fever, previous history of pneumonia, loss of appetite.   Cough Associated symptoms: chills   Associated symptoms: no chest pain, no fever, no headaches, no shortness of breath and no wheezing      Home Medications Prior to Admission medications   Not on File      Allergies    Patient has no known allergies.    Review of Systems   Review of Systems  Constitutional:  Positive for chills. Negative for fatigue and fever.  Respiratory:  Positive for cough. Negative for chest tightness, shortness of breath and wheezing.   Cardiovascular:  Negative for chest pain and palpitations.  Gastrointestinal:  Negative for abdominal pain, constipation, diarrhea, nausea and vomiting.  Neurological:  Negative for dizziness, seizures, weakness, light-headedness, numbness and headaches.    Physical Exam Updated Vital Signs BP 122/81 (BP Location: Right Arm)   Pulse 71   Temp 98 F (36.7 C) (Oral)   Resp 18   Ht 6\' 3"  (1.905 m)   Wt 79.4 kg   SpO2 98%   BMI 21.88 kg/m  Physical Exam Vitals and nursing note reviewed.  Constitutional:      General: He is not in acute distress.    Appearance: Normal appearance. He is not ill-appearing.  HENT:     Head: Normocephalic and atraumatic.     Right Ear: Hearing, ear canal and external ear normal. No tenderness. No middle ear effusion. Tympanic membrane is scarred. Tympanic membrane is not erythematous or  bulging.     Left Ear: Hearing, ear canal and external ear normal. No tenderness.  No middle ear effusion. Tympanic membrane is not scarred, erythematous or bulging.     Nose: Congestion present.     Right Nostril: No epistaxis.     Left Nostril: No epistaxis.     Mouth/Throat:     Lips: Pink.     Mouth: Mucous membranes are moist.     Pharynx: Uvula midline. No uvula swelling.     Tonsils: No tonsillar exudate or tonsillar abscesses. 0 on the right. 0 on the left.  Eyes:     Conjunctiva/sclera: Conjunctivae normal.  Cardiovascular:     Rate and Rhythm: Normal rate.  Pulmonary:     Effort: Pulmonary effort is normal. No respiratory distress.  Chest:     Chest wall: No tenderness.  Abdominal:     General: Bowel sounds are normal. There is no distension.     Palpations: Abdomen is soft.     Tenderness: There is no abdominal tenderness. There is no guarding or rebound.  Musculoskeletal:     Cervical back: Normal range of motion and neck supple. No rigidity or tenderness.     Right lower leg: No edema.     Left lower leg: No edema.  Skin:    Coloration: Skin is not jaundiced or pale.  Neurological:     Mental Status: He is alert.  Mental status is at baseline.     ED Results / Procedures / Treatments   Labs (all labs ordered are listed, but only abnormal results are displayed) Labs Reviewed  RESP PANEL BY RT-PCR (RSV, FLU A&B, COVID)  [id number]    EKG None  Radiology DG Chest 2 View Result Date: 11/24/2023 CLINICAL DATA:  Acute cough EXAM: CHEST - 2 VIEW COMPARISON:  08/17/2018 FINDINGS: The heart size and mediastinal contours are within normal limits. Both lungs are clear. The visualized skeletal structures are unremarkable. IMPRESSION: No active cardiopulmonary disease. Electronically Signed   By: Judie Petit.  Shick M.D.   On: 11/24/2023 09:02    Procedures Procedures    Medications Ordered in ED Medications - No data to display  ED Course/ Medical Decision Making/ A&P                                  Medical Decision Making Amount and/or Complexity of Data Reviewed Radiology: ordered.   Patient presents to the ED for concern of nasal congestion and productive cough for a week, this involves an extensive number of treatment options, and is a complaint that carries with it a high risk of complications and morbidity.  The differential diagnosis includes COVID, flu, viral infection, pneumonia, AOM.  Is not exhaustive list   Co morbidities that complicate the patient evaluation  None   Additional history obtained:  Additional history obtained from Nursing and Outside Medical Records   External records from outside source obtained and reviewed including triage RN note   Lab Tests:  I Ordered, and personally interpreted labs.  The pertinent results include:   Resp panel negative   Imaging Studies ordered:  I ordered imaging studies including chest x-ray I independently visualized and interpreted imaging which showed no pneumonia, effusion, nor acute cardiopulmonary abnormality I agree with the radiologist interpretation    Medicines ordered and prescription drug management:  I offered Tylenol or ibuprofen for pain however patient refuses at this time  I have reviewed the patients home medicines and have made adjustments as needed    Problem List / ED Course:  Nasal congestion Cough Respiratory panel and x-ray negative Return precautions provided to patient Discharge instructions provided.  Patient is satisfied with discharge at this time.  All questions answered to satisfaction.   Reevaluation:  After the interventions noted above, I reevaluated the patient and found that they have :stayed the same   Social Determinants of Health:  No pcp f/u -provided [place] [facility] wellness recommendation   Dispostion:  After consideration of the diagnostic results and the patients response to treatment, I feel that the  patent would benefit from outpatient management with supportive care.   Final Clinical Impression(s) / ED Diagnoses Final diagnoses:  Viral upper respiratory tract infection    Rx / DC Orders ED Discharge Orders     None        Judithann Sheen, PA 11/24/23 [id number]    Bethann Berkshire, MD 11/24/23 1721

## 2023-11-24 NOTE — ED Triage Notes (Signed)
Pt arrived reporting cough x1 week. Concerned for walking pneumonia. Denies fever, chills, aches or any other symptoms.
